# Patient Record
Sex: Female | Born: 1975 | Race: Black or African American | Hispanic: No | Marital: Married | Smoking: Never smoker
Health system: Southern US, Community
[De-identification: ages and names within clinical notes are randomized; demographics above are authoritative.]

## PROBLEM LIST (undated history)

## (undated) DIAGNOSIS — K219 Gastro-esophageal reflux disease without esophagitis: Secondary | ICD-10-CM

## (undated) DIAGNOSIS — O10919 Unspecified pre-existing hypertension complicating pregnancy, unspecified trimester: Secondary | ICD-10-CM

## (undated) DIAGNOSIS — G4482 Headache associated with sexual activity: Secondary | ICD-10-CM

## (undated) DIAGNOSIS — N939 Abnormal uterine and vaginal bleeding, unspecified: Secondary | ICD-10-CM

## (undated) DIAGNOSIS — I10 Essential (primary) hypertension: Secondary | ICD-10-CM

## (undated) DIAGNOSIS — O1490 Unspecified pre-eclampsia, unspecified trimester: Secondary | ICD-10-CM

## (undated) DIAGNOSIS — D649 Anemia, unspecified: Secondary | ICD-10-CM

## (undated) HISTORY — PX: HERNIA REPAIR: SHX51

## (undated) HISTORY — DX: Unspecified pre-eclampsia, unspecified trimester: O14.90

## (undated) HISTORY — DX: Headache associated with sexual activity: G44.82

## (undated) HISTORY — DX: Abnormal uterine and vaginal bleeding, unspecified: N93.9

## (undated) HISTORY — DX: Unspecified pre-existing hypertension complicating pregnancy, unspecified trimester: O10.919

## (undated) HISTORY — DX: Anemia, unspecified: D64.9

## (undated) HISTORY — DX: Gastro-esophageal reflux disease without esophagitis: K21.9

## (undated) HISTORY — DX: Essential (primary) hypertension: I10

---

## 2012-12-01 ENCOUNTER — Ambulatory Visit (INDEPENDENT_AMBULATORY_CARE_PROVIDER_SITE_OTHER): Payer: BC Managed Care – PPO | Admitting: Family Medicine

## 2012-12-01 ENCOUNTER — Ambulatory Visit: Payer: BC Managed Care – PPO

## 2012-12-01 VITALS — BP 185/107 | HR 73 | Temp 98.2°F | Resp 16 | Ht 66.0 in | Wt 213.0 lb

## 2012-12-01 DIAGNOSIS — R0789 Other chest pain: Secondary | ICD-10-CM

## 2012-12-01 DIAGNOSIS — R002 Palpitations: Secondary | ICD-10-CM

## 2012-12-01 LAB — POCT CBC
Granulocyte percent: 67.9 %G (ref 37–80)
HCT, POC: 40.8 % (ref 37.7–47.9)
Hemoglobin: 13 g/dL (ref 12.2–16.2)
Lymph, poc: 2.3 (ref 0.6–3.4)
MCH, POC: 31.3 pg — AB (ref 27–31.2)
MCHC: 31.9 g/dL (ref 31.8–35.4)
MCV: 98.3 fL — AB (ref 80–97)
MID (cbc): 0.8 (ref 0–0.9)
MPV: 9.7 fL (ref 0–99.8)
POC Granulocyte: 6.7 (ref 2–6.9)
POC LYMPH PERCENT: 23.8 %L (ref 10–50)
POC MID %: 8.3 %M (ref 0–12)
Platelet Count, POC: 420 10*3/uL (ref 142–424)
RBC: 4.15 M/uL (ref 4.04–5.48)
RDW, POC: 13.6 %
WBC: 9.8 10*3/uL (ref 4.6–10.2)

## 2012-12-01 NOTE — Progress Notes (Signed)
37 yo woman who works in [employer] presents with chest pain and palpitations.  The palpitations have been present off and on for a year. The chest pain began a couple days ago.  Patient did have holter monitor test over the past year which was negative, and a stress test was also normal.  She checks her blood pressure daily and it usually runs 130/85.  At the dentist, the pressure was elevated recently.  Patient is recently engaged and has been working on her weight with new diet.  F/Hx positive for HTN, CAD and diabetes  Patient has been sneezing quite a bit  Sig Negatives:  Shortness of breath, nausea, arm pain, syncope  Objective: NAD  BP 144/82 recheck Chest:  Clear Heart:  Regular with some variability corresponding to respirations.  No murmur Neck:  Supple, no adenopathy or thyromegaly. Abdomen:  Soft, nontender without HSM Ext:  No edema, no calf or thigh tenderness.  [facility] reading (PRIMARY) by  Dr. Milus Glazier CXR:  Normal EKG:  normal  Assessment: atypical chest pain  Plan:  Start magnesium supplement Palpitations - Plan: EKG 12-Lead, Comprehensive metabolic panel, TSH, Magnesium, EKG 12-Lead, POCT CBC  Other chest pain - Plan: EKG 12-Lead, Comprehensive metabolic panel, EKG 12-Lead, DG Chest 2 View, POCT CBC  Signed, Elvina Sidle, MD

## 2012-12-02 LAB — COMPREHENSIVE METABOLIC PANEL
ALT: 15 U/L (ref 0–35)
AST: 14 U/L (ref 0–37)
Albumin: 4.7 g/dL (ref 3.5–5.2)
Alkaline Phosphatase: 55 U/L (ref 39–117)
BUN: 17 mg/dL (ref 6–23)
CO2: 26 mEq/L (ref 19–32)
Calcium: 10.1 mg/dL (ref 8.4–10.5)
Chloride: 100 mEq/L (ref 96–112)
Creat: 0.77 mg/dL (ref 0.50–1.10)
Glucose, Bld: 88 mg/dL (ref 70–99)
Potassium: 4.1 mEq/L (ref 3.5–5.3)
Sodium: 136 mEq/L (ref 135–145)
Total Bilirubin: 0.4 mg/dL (ref 0.3–1.2)
Total Protein: 7.5 g/dL (ref 6.0–8.3)

## 2012-12-02 LAB — TSH: TSH: 0.986 u[IU]/mL (ref 0.350–4.500)

## 2012-12-02 LAB — MAGNESIUM: Magnesium: 1.9 mg/dL (ref 1.5–2.5)

## 2013-03-11 ENCOUNTER — Ambulatory Visit (INDEPENDENT_AMBULATORY_CARE_PROVIDER_SITE_OTHER): Payer: BC Managed Care – PPO | Admitting: Emergency Medicine

## 2013-03-11 VITALS — BP 142/80 | HR 71 | Temp 97.7°F | Resp 16 | Ht 67.0 in | Wt 214.6 lb

## 2013-03-11 DIAGNOSIS — R109 Unspecified abdominal pain: Secondary | ICD-10-CM

## 2013-03-11 LAB — POCT UA - MICROSCOPIC ONLY
Casts, Ur, LPF, POC: NEGATIVE
Mucus, UA: NEGATIVE
Yeast, UA: NEGATIVE

## 2013-03-11 LAB — POCT URINALYSIS DIPSTICK
Bilirubin, UA: NEGATIVE
Blood, UA: NEGATIVE
Glucose, UA: NEGATIVE
Nitrite, UA: NEGATIVE
Urobilinogen, UA: 0.2
pH, UA: 7

## 2013-03-11 LAB — POCT CBC
Granulocyte percent: 68.2 %G (ref 37–80)
HCT, POC: 39.6 % (ref 37.7–47.9)
Hemoglobin: 12.6 g/dL (ref 12.2–16.2)
Lymph, poc: 2.1 (ref 0.6–3.4)
MCV: 98.7 fL — AB (ref 80–97)
POC Granulocyte: 5.6 (ref 2–6.9)

## 2013-03-11 LAB — IFOBT (OCCULT BLOOD): IFOBT: NEGATIVE

## 2013-03-11 NOTE — Progress Notes (Signed)
  Subjective:    Patient ID: Bethany [name], female    DOB: April 19, 1976, 37 y.o.   MRN: [id number]  HPI  37 YO female here today with lower pelvic pain radiating to her buttock area. She felt during her period when she had a BM she would feel extra cramps. She has not had a BM today. She states she is pretty regular- going 1-2 times daily. She feels like she has to go but when she tries she is not successful. She has pressure and "bloating" like she has gas.   Her last period was in the middle of July. She is not currently on any birth control. Denies fever, chills or feeling sick.   Review of Systems     Objective:   Physical Exam chest is clear heart rate and murmurs abdomen soft there are no areas of tenderness rectal exam revealed an empty rectal vault.  Results for orders placed in visit on 03/11/13  POCT URINE PREGNANCY      Result Value Range   Preg Test, Ur Negative    IFOBT (OCCULT BLOOD)      Result Value Range   IFOBT Negative    POCT UA - MICROSCOPIC ONLY      Result Value Range   WBC, Ur, HPF, POC 5-8     RBC, urine, microscopic neg     Bacteria, U Microscopic small     Mucus, UA neg     Epithelial cells, urine per micros 5-8     Crystals, Ur, HPF, POC neg     Casts, Ur, LPF, POC neg     Yeast, UA neg    POCT URINALYSIS DIPSTICK      Result Value Range   Color, UA yellow     Clarity, UA clear     Glucose, UA neg     Bilirubin, UA neg     Ketones, UA neg     Spec Grav, UA 1.015     Blood, UA neg     pH, UA 7.0     Protein, UA neg     Urobilinogen, UA 0.2     Nitrite, UA neg     Leukocytes, UA Trace          Assessment & Plan:

## 2013-03-11 NOTE — Patient Instructions (Addendum)
Try a Dulcolax suppository or fleets enema Abdominal Pain Abdominal pain can be caused by many things. Your caregiver decides the seriousness of your pain by an examination and possibly blood tests and X-rays. Many cases can be observed and treated at home. Most abdominal pain is not caused by a disease and will probably improve without treatment. However, in many cases, more time must pass before a clear cause of the pain can be found. Before that point, it may not be known if you need more testing, or if hospitalization or surgery is needed. HOME CARE INSTRUCTIONS   Do not take laxatives unless directed by your caregiver.  Take pain medicine only as directed by your caregiver.  Only take over-the-counter or prescription medicines for pain, discomfort, or fever as directed by your caregiver.  Try a clear liquid diet (broth, tea, or water) for as long as directed by your caregiver. Slowly move to a bland diet as tolerated. SEEK IMMEDIATE MEDICAL CARE IF:   The pain does not go away.  You have a fever.  You keep throwing up (vomiting).  The pain is felt only in portions of the abdomen. Pain in the right side could possibly be appendicitis. In an adult, pain in the left lower portion of the abdomen could be colitis or diverticulitis.  You pass bloody or black tarry stools. MAKE SURE YOU:   Understand these instructions.  Will watch your condition.  Will get help right away if you are not doing well or get worse. Document Released: 04/30/2005 Document Revised: 10/13/2011 Document Reviewed: 03/08/2008 [facility] Patient Information 2014 [place], [place].  and if no results then try MiraLax.

## 2013-05-20 ENCOUNTER — Ambulatory Visit (INDEPENDENT_AMBULATORY_CARE_PROVIDER_SITE_OTHER): Payer: BC Managed Care – PPO | Admitting: Internal Medicine

## 2013-05-20 VITALS — BP 144/88 | HR 71 | Temp 98.8°F | Resp 17 | Ht 68.0 in | Wt 223.0 lb

## 2013-05-20 DIAGNOSIS — L0232 Furuncle of buttock: Secondary | ICD-10-CM

## 2013-05-20 DIAGNOSIS — L02224 Furuncle of groin: Secondary | ICD-10-CM

## 2013-05-20 DIAGNOSIS — L02239 Carbuncle of trunk, unspecified: Secondary | ICD-10-CM

## 2013-05-20 DIAGNOSIS — L0233 Carbuncle of buttock: Secondary | ICD-10-CM

## 2013-05-20 DIAGNOSIS — L02229 Furuncle of trunk, unspecified: Secondary | ICD-10-CM

## 2013-05-20 MED ORDER — DOXYCYCLINE HYCLATE 100 MG PO TABS: 100.0000 mg | ORAL_TABLET | Freq: Two times a day (BID) | ORAL | Status: DC

## 2013-05-20 MED ORDER — MUPIROCIN 2 % EX OINT: TOPICAL_OINTMENT | Freq: Three times a day (TID) | CUTANEOUS | Status: DC

## 2013-05-20 NOTE — Progress Notes (Signed)
  Subjective:    Patient ID: Bethany [name], female    DOB: Feb 04, 1976, 37 y.o.   MRN: [id number]  HPI 37 year old female present with an issue of a boil showing up around her groin that keeps coming back and not fully healing. Has a history of boils and they normally go away. Her brother lived with her for a while and he had MRSA. She just wants to make sure there is no infection. Her primary physician was Dr. Stevphen Meuse at [facility] before she just recently retired. Has been seeing an OB/GYN for PCOS.  She is not pregnant.  Review of Systems     Objective:   Physical Exam  Constitutional: She is oriented to person, place, and time. She appears well-developed and well-nourished.  HENT:  Head: Normocephalic.  Eyes: EOM are normal.  Pulmonary/Chest: Effort normal.  Musculoskeletal: Normal range of motion.  Neurological: She is alert and oriented to person, place, and time. She exhibits normal muscle tone. Coordination normal.  Skin: Lesion and rash noted. Rash is nodular and pustular. There is erythema.     Psychiatric: She has a normal mood and affect.   Has 2 boils, both small, with purulent discharge. No ID needed at this time.  CS done     Assessment & Plan:  MRSA precautions Doxy/Mupirocin

## 2013-05-20 NOTE — Patient Instructions (Signed)
Infecciones por estafilococo (Staphylococcal Infections) El estafilococo aureus (o Staph, abreviado en ingls) es un germen que puede causar infecciones, en especial sobre la piel lastimada o heridas. La meticilina es una droga que se [place] a veces para tratar estas infecciones. Si el germen es resistente a Manufacturing systems engineer, se lo denomina MRSA. La meticilina y algunas otras drogas pueden no funcionar para tratar la infeccin. Sin embargo, hay otras drogas antibiticas que podran utilizarse para tratar la infeccin. El mdico podr Education officer, environmental un cultivo de la herida, piel u otro sitio. Esto le ayudar a asegurarse de que tiene MRSA. A veces las personas sanas son portadoras de MRSA, pero tambin pueden ocasionar una infeccin.  Las infecciones por staph, incluido MRSA pueden contagiarse de persona a persona por contacto. Podr prevenir el contagio de MRSA en aquellos con los que vive o las personas que lo rodean si sigue estos pasos:  Mantenga las infecciones, supuras o pus cubiertas con vendas limpias y secas. Siga las instrucciones del mdico acerca del cuidado adecuado de la herida. El pus de las heridas infectadas puede contener MRSA y diseminar el germen (bacteria) a otros.  Aconseje a su familia y a otros contactos cercanos que laven sus manos frecuentemente con agua caliente y [place]. Esto debe realizarse especialmente cuando cambia los vendajes o toca la herida infectada o algn material contagioso.  Evite compartir elementos personales (toallas, toallitas, navajas, ropa o uniformes) que puedan haber tenido contacto con la herida infectada.  Lave la ropa de cama y de vestir con agua caliente y jabn para ropa. Secar la ropa en una secadora de aire caliente, en lugar de secarla al aire, ayudar a eliminar la bacteria de los gneros.  Avise a los profesionales que lo asisten que tiene una infeccin por MRSA. En el hospital se tomarn medidas para evitar el contagio.  Pregunte al profesional que lo  asiste sobre el regreso a la escuela o al trabajo si tiene una infeccin por MRSA o "Staph".  Si el mdico le ha recomendado una evaluacin de seguimiento, es importante que concurra a la cita. Si no cumple con el seguimiento podr resultar en una lesin crnica o permanente, dolor e incapacidad. Si existe algn problema que le impide concurrir a la cita, Multimedia programmer a las instalaciones para obtener asistencia. La infeccin por MRSA o "staph" puede ser muy grave y deber ponerse en contacto con el mdico si empeora. Para combatir la infeccin, siga las indicaciones del profesional que lo asiste para el cuidado de la herida, y tome todos los medicamentos que le hayan prescripto. SOLICITE ATENCIN MDICA SI:  Observa un aumento de secrecin de pus en la zona de la herida.  Tiene fiebre.  Advierte un olor ftido que proviene de la herida o del vendaje. SOLICITE ATENCIN MDICA DE INMEDIATO SI: Observa enrojecimiento, vetas rojizas, hinchazn, o aumenta el dolor en la herida. Document Released: 07/03/2008 Document Revised: 10/13/2011 [facility] Patient Information 2014 [place], [place]. MRSA Overview MRSA stands for methicillin-resistant Staphylococcus aureus. It is a type of bacteria that is resistant to some common antibiotics. It can cause infections in the skin and many other places in the body. Staphylococcus aureus, often called "staph," is a bacteria that normally lives on the skin or in the nose. Staph on the surface of the skin or in the nose does not cause problems. However, if the staph enters the body through a cut, wound, or break in the skin, an infection can happen. Up until recently, infections with the MRSA  type of staph mainly occurred in hospitals and other healthcare settings. There are now increasing problems with MRSA infections in the community as well. Infections with MRSA may be very serious or even life-threatening. Most MRSA infections are acquired in one of two  ways:  Healthcare-associated MRSA (HA-MRSA)  This can be acquired by people in any healthcare setting. MRSA can be a big problem for hospitalized people, people in nursing homes, people in rehabilitation facilities, people with weakened immune systems, dialysis patients, and those who have had surgery.  Community-associated MRSA (CA-MRSA)  Community spread of MRSA is becoming more common. It is known to spread in crowded settings, in jails and prisons, and in situations where there is close skin-to-skin contact, such as during sporting events or in locker rooms. MRSA can be spread through shared items, such as children's toys, razors, towels, or sports equipment. CAUSES  All staph, including MRSA, are normally harmless unless they enter the body through a scratch, cut, or wound, such as with surgery. All staph, including MRSA, can be spread from person-to-person by touching contaminated objects or through direct contact. SPECIAL GROUPS MRSA can present problems for special groups of people. Some of these groups include:  Breastfeeding women.  The most common problem is MRSA infection of the breast (mastitis). There is evidence that MRSA can be passed to an infant from infected breast milk. Your caregiver may recommend that you stop breastfeeding until the mastitis is under control.  If you are breastfeeding and have a MRSA infection in a place other than the breast, you may usually continue breastfeeding while under treatment. If taking antibiotics, ask your caregiver if it is safe to continue breastfeeding while taking your prescribed medicines.  Neonates (babies from birth to 54 month old) and infants (babies from 50 month to 83 year old).  There is evidence that MRSA can be passed to a newborn at birth if the mother has MRSA on the skin, in or around the birth canal, or an infection in the uterus, cervix, or vagina. MRSA infection can have the same appearance as a normal newborn or infant rash  or several other skin infections. This can make it hard to diagnose MRSA.  Immune compromised people.  If you have an immune system problem, you may have a higher chance of developing a MRSA infection.  People after any type of surgery.  Staph in general, including MRSA, is the most common cause of infections occurring at the site of recent surgery.  People on long-term steroid medicines.  These kinds of medicines can lower your resistance to infection. This can increase your chance of getting MRSA.  People who have had frequent hospitalizations, live in nursing homes or other residential care facilities, have venous or urinary catheters, or have taken multiple courses of antibiotic therapy for any reason. DIAGNOSIS  Diagnosis of MRSA is done by cultures of fluid samples that may come from:  Swabs taken from cuts or wounds in infected areas.  Nasal swabs.  Saliva or deep cough specimens from the lungs (sputum).  Urine.  Blood. Many people are "colonized" with MRSA but have no signs of infection. This means that people carry the MRSA germ on their skin or in their nose and may never develop MRSA infection.  TREATMENT  Treatment varies and is based on how serious, how deep, or how extensive the infection is. For example:  Some skin infections, such as a small boil or abscess, may be treated by draining yellowish-white fluid (pus)  from the site of the infection.  Deeper or more widespread soft tissue infections are usually treated with surgery to drain pus and with antibiotic medicine given by vein or by mouth. This may be recommended even if you are pregnant.  Serious infections may require a hospital stay. If antibiotics are given, they may be needed for several weeks. PREVENTION  Because many people are colonized with staph, including MRSA, preventing the spread of the bacteria from person-to-person is most important. The best way to prevent the spread of bacteria and other  germs is through proper hand washing or by using alcohol-based hand disinfectants. The following are other ways to help prevent MRSA infection within the hospital and community settings.   Healthcare settings:  Strict hand washing or hand disinfection procedures need to be followed before and after touching every patient.  Patients infected with MRSA are placed in isolation to prevent the spread of the bacteria.  Healthcare workers need to wear disposable gowns and gloves when touching or caring for patients infected with MRSA. Visitors may also be asked to wear a gown and gloves.  Hospital surfaces need to be disinfected frequently.  Community settings:  NIKE frequently with soap and water for at least 15 seconds. Otherwise, use alcohol-based hand disinfectants when soap and water is not available.  Make sure people who live with you wash their hands often, too.  Do not share personal items. For example, avoid sharing razors and other personal hygiene items, towels, clothing, and athletic equipment.  Wash and dry your clothes and bedding at the warmest temperatures recommended on the labels.  Keep wounds covered. Pus from infected sores may contain MRSA and other bacteria. Keep cuts and abrasions clean and covered with germ-free (sterile), dry bandages until they are healed.  If you have a wound that appears infected, ask your caregiver if a culture for MRSA and other bacteria should be done.  If you are breastfeeding, talk to your caregiver about MRSA. You may be asked to temporarily stop breastfeeding. HOME CARE INSTRUCTIONS   Take your antibiotics as directed. Finish them even if you start to feel better.  Avoid close contact with those around you as much as possible. Do not use towels, razors, toothbrushes, bedding, or other items that will be used by others.  To fight the infection, follow your caregiver's instructions for wound care. Wash your hands before and after  changing your bandages.  If you have an intravascular device, such as a catheter, make sure you know how to care for it.  Be sure to tell any healthcare providers that you have MRSA so they are aware of your infection. SEEK IMMEDIATE MEDICAL CARE IF:   The infection appears to be getting worse. Signs include:  Increased warmth, redness, or tenderness around the wound site.  A red line that extends from the infection site.  A dark color in the area around the infection.  Wound drainage that is tan, yellow, or green.  A bad smell coming from the wound.  You feel sick to your stomach (nauseous) and throw up (vomit) or cannot keep medicine down.  You have a fever.  Your baby is older than 3 months with a rectal temperature of 102 F (38.9 C) or higher.  Your baby is 38 months old or younger with a rectal temperature of 100.4 F (38 C) or higher.  You have difficulty breathing. MAKE SURE YOU:   Understand these instructions.  Will watch your condition.  Will get help right away if you are not doing well or get worse. Document Released: 07/21/2005 Document Revised: 10/13/2011 Document Reviewed: 10/23/2010 [facility] Patient Information 2014 [place], [place]. MRSA Overview MRSA stands for methicillin-resistant Staphylococcus aureus. It is a type of bacteria that is resistant to some common antibiotics. It can cause infections in the skin and many other places in the body. Staphylococcus aureus, often called "staph," is a bacteria that normally lives on the skin or in the nose. Staph on the surface of the skin or in the nose does not cause problems. However, if the staph enters the body through a cut, wound, or break in the skin, an infection can happen. Up until recently, infections with the MRSA type of staph mainly occurred in hospitals and other healthcare settings. There are now increasing problems with MRSA infections in the community as well. Infections with MRSA may be very  serious or even life-threatening. Most MRSA infections are acquired in one of two ways:  Healthcare-associated MRSA (HA-MRSA)  This can be acquired by people in any healthcare setting. MRSA can be a big problem for hospitalized people, people in nursing homes, people in rehabilitation facilities, people with weakened immune systems, dialysis patients, and those who have had surgery.  Community-associated MRSA (CA-MRSA)  Community spread of MRSA is becoming more common. It is known to spread in crowded settings, in jails and prisons, and in situations where there is close skin-to-skin contact, such as during sporting events or in locker rooms. MRSA can be spread through shared items, such as children's toys, razors, towels, or sports equipment. CAUSES  All staph, including MRSA, are normally harmless unless they enter the body through a scratch, cut, or wound, such as with surgery. All staph, including MRSA, can be spread from person-to-person by touching contaminated objects or through direct contact. SPECIAL GROUPS MRSA can present problems for special groups of people. Some of these groups include:  Breastfeeding women.  The most common problem is MRSA infection of the breast (mastitis). There is evidence that MRSA can be passed to an infant from infected breast milk. Your caregiver may recommend that you stop breastfeeding until the mastitis is under control.  If you are breastfeeding and have a MRSA infection in a place other than the breast, you may usually continue breastfeeding while under treatment. If taking antibiotics, ask your caregiver if it is safe to continue breastfeeding while taking your prescribed medicines.  Neonates (babies from birth to 34 month old) and infants (babies from 64 month to 43 year old).  There is evidence that MRSA can be passed to a newborn at birth if the mother has MRSA on the skin, in or around the birth canal, or an infection in the uterus, cervix, or  vagina. MRSA infection can have the same appearance as a normal newborn or infant rash or several other skin infections. This can make it hard to diagnose MRSA.  Immune compromised people.  If you have an immune system problem, you may have a higher chance of developing a MRSA infection.  People after any type of surgery.  Staph in general, including MRSA, is the most common cause of infections occurring at the site of recent surgery.  People on long-term steroid medicines.  These kinds of medicines can lower your resistance to infection. This can increase your chance of getting MRSA.  People who have had frequent hospitalizations, live in nursing homes or other residential care facilities, have venous or urinary catheters, or have taken  multiple courses of antibiotic therapy for any reason. DIAGNOSIS  Diagnosis of MRSA is done by cultures of fluid samples that may come from:  Swabs taken from cuts or wounds in infected areas.  Nasal swabs.  Saliva or deep cough specimens from the lungs (sputum).  Urine.  Blood. Many people are "colonized" with MRSA but have no signs of infection. This means that people carry the MRSA germ on their skin or in their nose and may never develop MRSA infection.  TREATMENT  Treatment varies and is based on how serious, how deep, or how extensive the infection is. For example:  Some skin infections, such as a small boil or abscess, may be treated by draining yellowish-white fluid (pus) from the site of the infection.  Deeper or more widespread soft tissue infections are usually treated with surgery to drain pus and with antibiotic medicine given by vein or by mouth. This may be recommended even if you are pregnant.  Serious infections may require a hospital stay. If antibiotics are given, they may be needed for several weeks. PREVENTION  Because many people are colonized with staph, including MRSA, preventing the spread of the bacteria from  person-to-person is most important. The best way to prevent the spread of bacteria and other germs is through proper hand washing or by using alcohol-based hand disinfectants. The following are other ways to help prevent MRSA infection within the hospital and community settings.   Healthcare settings:  Strict hand washing or hand disinfection procedures need to be followed before and after touching every patient.  Patients infected with MRSA are placed in isolation to prevent the spread of the bacteria.  Healthcare workers need to wear disposable gowns and gloves when touching or caring for patients infected with MRSA. Visitors may also be asked to wear a gown and gloves.  Hospital surfaces need to be disinfected frequently.  Community settings:  NIKE frequently with soap and water for at least 15 seconds. Otherwise, use alcohol-based hand disinfectants when soap and water is not available.  Make sure people who live with you wash their hands often, too.  Do not share personal items. For example, avoid sharing razors and other personal hygiene items, towels, clothing, and athletic equipment.  Wash and dry your clothes and bedding at the warmest temperatures recommended on the labels.  Keep wounds covered. Pus from infected sores may contain MRSA and other bacteria. Keep cuts and abrasions clean and covered with germ-free (sterile), dry bandages until they are healed.  If you have a wound that appears infected, ask your caregiver if a culture for MRSA and other bacteria should be done.  If you are breastfeeding, talk to your caregiver about MRSA. You may be asked to temporarily stop breastfeeding. HOME CARE INSTRUCTIONS   Take your antibiotics as directed. Finish them even if you start to feel better.  Avoid close contact with those around you as much as possible. Do not use towels, razors, toothbrushes, bedding, or other items that will be used by others.  To fight the  infection, follow your caregiver's instructions for wound care. Wash your hands before and after changing your bandages.  If you have an intravascular device, such as a catheter, make sure you know how to care for it.  Be sure to tell any healthcare providers that you have MRSA so they are aware of your infection. SEEK IMMEDIATE MEDICAL CARE IF:   The infection appears to be getting worse. Signs include:  Increased warmth,  redness, or tenderness around the wound site.  A red line that extends from the infection site.  A dark color in the area around the infection.  Wound drainage that is tan, yellow, or green.  A bad smell coming from the wound.  You feel sick to your stomach (nauseous) and throw up (vomit) or cannot keep medicine down.  You have a fever.  Your baby is older than 3 months with a rectal temperature of 102 F (38.9 C) or higher.  Your baby is 4 months old or younger with a rectal temperature of 100.4 F (38 C) or higher.  You have difficulty breathing. MAKE SURE YOU:   Understand these instructions.  Will watch your condition.  Will get help right away if you are not doing well or get worse. Document Released: 07/21/2005 Document Revised: 10/13/2011 Document Reviewed: 10/23/2010 [facility] Patient Information 2014 [place], [place].

## 2013-05-23 LAB — WOUND CULTURE: Gram Stain: NONE SEEN

## 2013-06-23 ENCOUNTER — Encounter: Payer: Self-pay | Admitting: Family Medicine

## 2013-06-23 ENCOUNTER — Ambulatory Visit (INDEPENDENT_AMBULATORY_CARE_PROVIDER_SITE_OTHER): Payer: BC Managed Care – PPO | Admitting: Family Medicine

## 2013-06-23 VITALS — BP 148/91 | HR 61 | Temp 98.0°F | Resp 16 | Ht 66.5 in | Wt 219.0 lb

## 2013-06-23 DIAGNOSIS — I10 Essential (primary) hypertension: Secondary | ICD-10-CM

## 2013-06-23 DIAGNOSIS — R002 Palpitations: Secondary | ICD-10-CM

## 2013-06-23 DIAGNOSIS — E282 Polycystic ovarian syndrome: Secondary | ICD-10-CM | POA: Insufficient documentation

## 2013-06-23 MED ORDER — LOSARTAN POTASSIUM-HCTZ 100-12.5 MG PO TABS: 1.0000 | ORAL_TABLET | Freq: Every day | ORAL | Status: DC

## 2013-06-23 NOTE — Patient Instructions (Addendum)
Hypertension As your heart beats, it forces blood through your arteries. This force is your blood pressure. If the pressure is too high, it is called hypertension (HTN) or high blood pressure. HTN is dangerous because you may have it and not know it. High blood pressure may mean that your heart has to work harder to pump blood. Your arteries may be narrow or stiff. The extra work puts you at risk for heart disease, stroke, and other problems.  Blood pressure consists of two numbers, a higher number over a lower, 110/72, for example. It is stated as "110 over 72." The ideal is below 120 for the top number (systolic) and under 80 for the bottom (diastolic). Write down your blood pressure today. You should pay close attention to your blood pressure if you have certain conditions such as:  Heart failure.  Prior heart attack.  Diabetes  Chronic kidney disease.  Prior stroke.  Multiple risk factors for heart disease. To see if you have HTN, your blood pressure should be measured while you are seated with your arm held at the level of the heart. It should be measured at least twice. A one-time elevated blood pressure reading (especially in the Emergency Department) does not mean that you need treatment. There may be conditions in which the blood pressure is different between your right and left arms. It is important to see your caregiver soon for a recheck. Most people have essential hypertension which means that there is not a specific cause. This type of high blood pressure may be lowered by changing lifestyle factors such as:  Stress.  Smoking.  Lack of exercise.  Excessive weight.  Drug/tobacco/alcohol use.  Eating less salt. Most people do not have symptoms from high blood pressure until it has caused damage to the body. Effective treatment can often prevent, delay or reduce that damage. TREATMENT  When a cause has been identified, treatment for high blood pressure is directed at the  cause. There are a large number of medications to treat HTN. These fall into several categories, and your caregiver will help you select the medicines that are best for you. Medications may have side effects. You should review side effects with your caregiver. If your blood pressure stays high after you have made lifestyle changes or started on medicines,   Your medication(s) may need to be changed.  Other problems may need to be addressed.  Be certain you understand your prescriptions, and know how and when to take your medicine.  Be sure to follow up with your caregiver within the time frame advised (usually within two weeks) to have your blood pressure rechecked and to review your medications.  If you are taking more than one medicine to lower your blood pressure, make sure you know how and at what times they should be taken. Taking two medicines at the same time can result in blood pressure that is too low. SEEK IMMEDIATE MEDICAL CARE IF:  You develop a severe headache, blurred or changing vision, or confusion.  You have unusual weakness or numbness, or a faint feeling.  You have severe chest or abdominal pain, vomiting, or breathing problems. MAKE SURE YOU:   Understand these instructions.  Will watch your condition.  Will get help right away if you are not doing well or get worse. Document Released: 07/21/2005 Document Revised: 10/13/2011 Document Reviewed: 03/10/2008 [facility] Patient Information 2014 [place], [place]. Lactose Intolerance, Adult Lactose intolerance is when the body is not able to digest lactose, a  sugar found in milk and milk products. Lactose intolerance is caused by your body not producing enough of the enzyme lactase. When there is not enough lactase to digest the amount of lactose consumed, discomfort may be felt. Lactose intolerance is not a milk allergy. For most people, lactase deficiency is a condition that develops naturally over time. After about the age  of 2, the body begins to produce less lactase. But many people may not experience symptoms until they are much older. CAUSES Things that can cause you to be lactose intolerant include:  Aging.  Being born without the ability to make lactase.  Certain digestive diseases.  Injuries to the small intestine. SYMPTOMS   Feeling sick to your stomach (nauseous).  Diarrhea.  Cramps.  Bloating.  Gas. Symptoms usually show up a half hour or 2 hours after eating or drinking products containing lactose. TREATMENT  No treatment can improve the body's ability to produce lactase. However, symptoms can be controlled through diet. A medicine may be given to you to take when you consume lactose-containing foods or drinks. The medicine contains the lactase enzyme, which help the body digest lactose better. HOME CARE INSTRUCTIONS  Eat or drink dairy products as told by your caregiver or dietician.  Take all medicine as directed by your caregiver.  Find lactose-free or lactose-reduced products at your local grocery store.  Talk to your caregiver or dietician to decide if you need any dietary supplements. The following is the amount of calcium needed from the diet:  19 to 50 years: 1000 mg  Over 50 years: 1200 mg Calcium and Lactose in Common Foods Non-Dairy Products / Calcium Content (mg)  Calcium-fortified orange juice, 1 cup / 308 to 344 mg  Sardines, with edible bones, 3 oz / 270 mg  Salmon, canned, with edible bones, 3 oz / 205 mg  Soymilk, fortified, 1 cup / 200 mg  Broccoli (raw), 1 cup / 90 mg  Orange, 1 medium / 50 mg  Pinto beans,  cup / 40 mg  Tuna, canned, 3 oz / 10 mg  Lettuce greens,  cup / 10 mg Dairy Products / Calcium Content (mg) / Lactose Content (g)  Yogurt, plain, low-fat, 1 cup / 415 mg / 5 g  Milk, reduced fat, 1 cup / 295 mg / 11 g  Swiss cheese, 1 oz / 270 mg / 1 g  Ice cream,  cup / 85 mg / 6 g  Cottage cheese,  cup / 75 mg / 2 to 3 g SEEK  MEDICAL CARE IF: You have no relief from your symptoms. Document Released: 07/21/2005 Document Revised: 10/13/2011 Document Reviewed: 10/18/2010 [facility] Patient Information 2014 [place], [place]. Polycystic Ovarian Syndrome Polycystic ovarian syndrome is a condition with a number of problems. One problem is with the ovaries. The ovaries are organs located in the female pelvis, on each side of the uterus. Usually, during the menstrual cycle, an egg is released from 1 ovary every month. This is called ovulation. When the egg is fertilized, it goes into the womb (uterus), which allows for the growth of a baby. The egg travels from the ovary through the fallopian tube to the uterus. The ovaries also make the hormones estrogen and progesterone. These hormones help the development of a woman's breasts, body shape, and body hair. They also regulate the menstrual cycle and pregnancy. Sometimes, cysts form in the ovaries. A cyst is a fluid-filled sac. On the ovary, different types of cysts can form. The most common  type of ovarian cyst is called a functional or ovulation cyst. It is normal, and often forms during the normal menstrual cycle. Each month, a woman's ovaries grow tiny cysts that hold the eggs. When an egg is fully grown, the sac breaks open. This releases the egg. Then, the sac which released the egg from the ovary dissolves. In one type of functional cyst, called a follicle cyst, the sac does not break open to release the egg. It may actually continue to grow. This type of cyst usually disappears within 1 to 3 months.  One type of cyst problem with the ovaries is called Polycystic Ovarian Syndrome (PCOS). In this condition, many follicle cysts form, but do not rupture and produce an egg. This health problem can affect the following:  Menstrual cycle.  Heart.  Obesity.  Cancer of the uterus.  Fertility.  Blood vessels.  Hair growth (face and body) or  baldness.  Hormones.  Appearance.  High blood pressure.  Stroke.  Insulin production.  Inflammation of the liver.  Elevated blood cholesterol and triglycerides. CAUSES   No one knows the exact cause of PCOS.  Women with PCOS often have a mother or sister with PCOS. There is not yet enough proof to say this is inherited.  Many women with PCOS have a weight problem.  Researchers are looking at the relationship between PCOS and the body's ability to make insulin. Insulin is a hormone that regulates the change of sugar, starches, and other food into energy for the body's use, or for storage. Some women with PCOS make too much insulin. It is possible that the ovaries react by making too many female hormones, called androgens. This can lead to acne, excessive hair growth, weight gain, and ovulation problems.  Too much production of luteinizing hormone (LH) from the pituitary gland in the brain stimulates the ovary to produce too much female hormone (androgen). SYMPTOMS   Infrequent or no menstrual periods, and/or irregular bleeding.  Inability to get pregnant (infertility), because of not ovulating.  Increased growth of hair on the face, chest, stomach, back, thumbs, thighs, or toes.  Acne, oily skin, or dandruff.  Pelvic pain.  Weight gain or obesity, usually carrying extra weight around the waist.  Type 2 diabetes (this is the diabetes that usually does not need insulin).  High cholesterol.  High blood pressure.  Female-pattern baldness or thinning hair.  Patches of thickened and dark brown or black skin on the neck, arms, breasts, or thighs.  Skin tags, or tiny excess flaps of skin, in the armpits or neck area.  Sleep apnea (excessive snoring and breathing stops at times while asleep).  Deepening of the voice.  Gestational diabetes when pregnant.  Increased risk of miscarriage with pregnancy. DIAGNOSIS  There is no single test to diagnose PCOS.   Your caregiver  will:  Take a medical history.  Perform a pelvic exam.  Perform an ultrasound.  Check your female and female hormone levels.  Measure glucose or sugar levels in the blood.  Do other blood tests.  If you are producing too many female hormones, your caregiver will make sure it is from PCOS. At the physical exam, your caregiver will want to evaluate the areas of increased hair growth. Try to allow natural hair growth for a few days before the visit.  During a pelvic exam, the ovaries may be enlarged or swollen by the increased number of small cysts. This can be seen more easily by vaginal ultrasound or screening,  to examine the ovaries and lining of the uterus (endometrium) for cysts. The uterine lining may become thicker, if there has not been a regular period. TREATMENT  Because there is no cure for PCOS, it needs to be managed to prevent problems. Treatments are based on your symptoms. Treatment is also based on whether you want to have a baby or whether you need contraception.  Treatment may include:  Progesterone hormone, to start a menstrual period.  Birth control pills, to make you have regular menstrual periods.  Medicines to make you ovulate, if you want to get pregnant.  Medicines to control your insulin.  Medicine to control your blood pressure.  Medicine and diet, to control your high cholesterol and triglycerides in your blood.  Surgery, making small holes in the ovary, to decrease the amount of female hormone production. This is done through a long, lighted tube (laparoscope), placed into the pelvis through a tiny incision in the lower abdomen. Your caregiver will go over some of the choices with you. WOMEN WITH PCOS HAVE THESE CHARACTERISTICS:  High levels of female hormones called androgens.  An irregular or no menstrual cycle.  May have many small cysts in their ovaries. PCOS is the most common hormonal reproductive problem in women of childbearing age. WHY DO WOMEN  WITH PCOS HAVE TROUBLE WITH THEIR MENSTRUAL CYCLE? Each month, about 20 eggs start to mature in the ovaries. As one egg grows and matures, the follicle breaks open to release the egg, so it can travel through the fallopian tube for fertilization. When the single egg leaves the follicle, ovulation takes place. In women with PCOS, the ovary does not make all of the hormones it needs for any of the eggs to fully mature. They may start to grow and accumulate fluid, but no one egg becomes large enough. Instead, some may remain as cysts. Since no egg matures or is released, ovulation does not occur and the hormone progesterone is not made. Without progesterone, a woman's menstrual cycle is irregular or absent. Also, the cysts produce female hormones, which continue to prevent ovulation.  Document Released: 11/14/2004 Document Revised: 10/13/2011 Document Reviewed: 01/06/2013 [facility] Patient Information 2014 [place], [place].

## 2013-06-23 NOTE — Progress Notes (Signed)
Patient ID: Bethany [name] MRN: [id number], DOB: 04/10/1976, 37 y.o. Date of Encounter: 06/23/2013, 12:31 PM  Primary Physician: No PCP Per Patient  Chief Complaint: HTN  HPI: 37 y.o. year old female with history below presents for hypertension follow up. She had been taking losartan and metoprolol until she saw her gynecologist who recommended she come off losartan K. she wants to have another child. At this point patient is not trying to get pregnant and is holding off on more children until she gets her blood pressure control and loses some weight. She had preeclampsia with her first child.  Patient has been diagnosed with PCO S. by her gynecologist. She has no side effects with losartan. She's had no more palpitations  No CP, HA, visual changes, or focal deficits.   Past Medical History  Diagnosis Date  . GERD (gastroesophageal reflux disease)   . Hypertension      Home Meds: Prior to Admission medications   Medication Sig Start Date End Date Taking? Authorizing Provider  docusate sodium (COLACE) 100 MG capsule Take 100 mg by mouth daily.    Historical Provider, MD  doxycycline (VIBRA-TABS) 100 MG tablet Take 1 tablet (100 mg total) by mouth 2 (two) times daily. 05/20/13   Jonita Albee, MD  folic acid (FOLVITE) 1 MG tablet Take 1 mg by mouth daily.    Historical Provider, MD  losartan (COZAAR) 100 MG tablet Take 100 mg by mouth daily.    Historical Provider, MD  metoprolol-hydrochlorothiazide (LOPRESSOR HCT) 50-25 MG per tablet Take 1 tablet by mouth daily.    Historical Provider, MD  mupirocin ointment (BACTROBAN) 2 % Apply topically 3 (three) times daily. 05/20/13   Jonita Albee, MD  simethicone (MYLICON) 125 MG chewable tablet Chew 125 mg by mouth daily.    Historical Provider, MD  zinc sulfate 220 MG capsule Take 220 mg by mouth daily.    Historical Provider, MD    Allergies: No Known Allergies  History   Social History  . Marital Status: Married    Spouse Name:  N/A    Number of Children: N/A  . Years of Education: N/A   Occupational History  . Not on file.   Social History Main Topics  . Smoking status: Never Smoker   . Smokeless tobacco: Not on file  . Alcohol Use: No  . Drug Use: No  . Sexual Activity: Yes    Birth Control/ Protection: None   Other Topics Concern  . Not on file   Social History Narrative  . No narrative on file     Family History  Problem Relation Age of Onset  . Hypertension Mother   . Cancer Father   . Diabetes Father   . Hypertension Father   . Diabetes Brother   . Kidney failure Brother   . Heart attack Paternal Grandmother   . Heart attack Paternal Grandfather     Review of Systems: Constitutional: negative for chills, fever, night sweats, weight changes, or fatigue  HEENT: negative for vision changes, hearing loss, congestion, rhinorrhea, ST, epistaxis, or sinus pressure Cardiovascular: negative for chest pain, palpitations, or DOE Respiratory: negative for hemoptysis, wheezing, shortness of breath, or cough Abdominal: negative for abdominal pain, nausea, vomiting, diarrhea, or constipation Dermatological: negative for rash Neurologic: negative for headache, dizziness, or syncope All other systems reviewed and are otherwise negative with the exception to those above and in the HPI.   Physical Exam: There were no vitals taken for this  visit., There is no weight on file to calculate BMI. General: Well developed, well nourished, in no acute distress. Head: Normocephalic, atraumatic, eyes without discharge, sclera non-icteric, nares are without discharge. Bilateral auditory canals clear, TM's are without perforation, pearly grey and translucent with reflective cone of light bilaterally. Oral cavity moist, posterior pharynx without exudate, erythema, peritonsillar abscess, or post nasal drip.  Neck: Supple. No thyromegaly. Full ROM. No lymphadenopathy. No carotid bruits. Lungs: Clear bilaterally to  auscultation without wheezes, rales, or rhonchi. Breathing is unlabored. Heart: RRR with S1 S2. No murmurs, rubs, or gallops appreciated.  Abdomen: Soft, non-tender, non-distended with normoactive bowel sounds. No hepatosplenomegaly. No rebound/guarding. No obvious abdominal masses. Msk:  Strength and tone normal for age. Extremities/Skin: Warm and dry. No clubbing or cyanosis. No edema. No rashes or suspicious lesions. Distal pulses 2+ and equal bilaterally. Neuro: Alert and oriented X 3. Moves all extremities spontaneously. Gait is normal. CNII-XII grossly in tact. DTR 2+, cerebellar function intact. Rhomberg normal. Psych:  Responds to questions appropriately with a normal affect.    ASSESSMENT AND PLAN:  37 y.o. year old female with hypertension, not well controlled at this point.  Hypertension - Plan: losartan-hydrochlorothiazide (HYZAAR) 100-12.5 MG per tablet  PCOS (polycystic ovarian syndrome)   Signed, Elvina Sidle, MD 06/23/2013 12:31 PM

## 2013-06-24 ENCOUNTER — Other Ambulatory Visit: Payer: Self-pay | Admitting: Internal Medicine

## 2013-06-27 NOTE — Telephone Encounter (Signed)
Do you want to RF this or have pt RTC? She was just seen again for another issue but this was not addressed.

## 2013-08-17 ENCOUNTER — Encounter: Payer: Self-pay | Admitting: Family Medicine

## 2013-08-17 ENCOUNTER — Ambulatory Visit (INDEPENDENT_AMBULATORY_CARE_PROVIDER_SITE_OTHER): Payer: BC Managed Care – PPO | Admitting: Family Medicine

## 2013-08-17 VITALS — BP 143/101 | HR 80 | Temp 98.2°F | Resp 16 | Ht 66.5 in | Wt 216.0 lb

## 2013-08-17 DIAGNOSIS — Z22322 Carrier or suspected carrier of Methicillin resistant Staphylococcus aureus: Secondary | ICD-10-CM

## 2013-08-17 DIAGNOSIS — K589 Irritable bowel syndrome without diarrhea: Secondary | ICD-10-CM

## 2013-08-17 DIAGNOSIS — I10 Essential (primary) hypertension: Secondary | ICD-10-CM

## 2013-08-17 DIAGNOSIS — Z0000 Encounter for general adult medical examination without abnormal findings: Secondary | ICD-10-CM

## 2013-08-17 LAB — COMPREHENSIVE METABOLIC PANEL
ALT: 18 U/L (ref 0–35)
AST: 17 U/L (ref 0–37)
Albumin: 4.4 g/dL (ref 3.5–5.2)
Alkaline Phosphatase: 66 U/L (ref 39–117)
BUN: 13 mg/dL (ref 6–23)
CO2: 28 mEq/L (ref 19–32)
Calcium: 9.8 mg/dL (ref 8.4–10.5)
Chloride: 101 mEq/L (ref 96–112)
Creat: 0.65 mg/dL (ref 0.50–1.10)
Glucose, Bld: 97 mg/dL (ref 70–99)
Potassium: 4.2 mEq/L (ref 3.5–5.3)
Sodium: 137 mEq/L (ref 135–145)
Total Bilirubin: 0.6 mg/dL (ref 0.3–1.2)
Total Protein: 7.2 g/dL (ref 6.0–8.3)

## 2013-08-17 LAB — CBC
HCT: 37.5 % (ref 36.0–46.0)
Hemoglobin: 12.8 g/dL (ref 12.0–15.0)
MCH: 31.1 pg (ref 26.0–34.0)
MCHC: 34.1 g/dL (ref 30.0–36.0)
MCV: 91.2 fL (ref 78.0–100.0)
Platelets: 375 10*3/uL (ref 150–400)
RBC: 4.11 MIL/uL (ref 3.87–5.11)
RDW: 15 % (ref 11.5–15.5)
WBC: 5.6 10*3/uL (ref 4.0–10.5)

## 2013-08-17 LAB — POCT URINALYSIS DIPSTICK
Bilirubin, UA: NEGATIVE
Blood, UA: NEGATIVE
Glucose, UA: NEGATIVE
Ketones, UA: NEGATIVE
Leukocytes, UA: NEGATIVE
Nitrite, UA: NEGATIVE
Protein, UA: NEGATIVE
Spec Grav, UA: 1.02
Urobilinogen, UA: 0.2
pH, UA: 6.5

## 2013-08-17 LAB — LIPID PANEL
Cholesterol: 163 mg/dL (ref 0–200)
HDL: 54 mg/dL (ref >39)
LDL Cholesterol: 93 mg/dL (ref 0–99)
Total CHOL/HDL Ratio: 3 Ratio
Triglycerides: 78 mg/dL (ref <150)
VLDL: 16 mg/dL (ref 0–40)

## 2013-08-17 LAB — POCT SEDIMENTATION RATE: POCT SED RATE: 22 mm/hr (ref 0–22)

## 2013-08-17 LAB — TSH: TSH: 0.811 u[IU]/mL (ref 0.350–4.500)

## 2013-08-17 LAB — T4, FREE: Free T4: 1.41 ng/dL (ref 0.80–1.80)

## 2013-08-17 MED ORDER — DOXYCYCLINE HYCLATE 100 MG PO TABS
100.0000 mg | ORAL_TABLET | Freq: Two times a day (BID) | ORAL | Status: DC
Start: 2013-08-17 — End: 2013-11-19

## 2013-08-17 MED ORDER — LOSARTAN POTASSIUM-HCTZ 100-12.5 MG PO TABS: 1.0000 | ORAL_TABLET | Freq: Every day | ORAL | Status: DC

## 2013-08-17 NOTE — Patient Instructions (Signed)

## 2013-08-17 NOTE — Progress Notes (Signed)
   Subjective:    Patient ID: Bethany [name], female    DOB: 1976/03/22, 38 y.o.   MRN: [id number]  HPI 38 yo woman here for CPE Since September, LLQ of abdomen has had intermittent poorly defined pain associated with constipation and loose stools as well as back pain.  She saw gyn who diagnosed PCOS and small fibroid via vag probe ultrasound. She has been avoiding gluten which has resulted in decrease symptoms and decreased palpitations.  Married, 2 children.  Considering having a third child.  F/Hx:  Father died of stomach cancer last year  Review of Systems  Eyes: Positive for visual disturbance.  Cardiovascular: Positive for palpitations.  Gastrointestinal: Positive for diarrhea, constipation and abdominal distention.  Genitourinary: Positive for dysuria, urgency, menstrual problem and pelvic pain.  Musculoskeletal: Positive for arthralgias and back pain.       Objective:   Physical Exam Alert AA woman in NAD HEENT:  Missing tooth number15, cavity number16;  Fundi are normal, TM's normal Neck: supple, no adenop or thyromegaly Chest:  Clear Heart:  Reg, no murmur Abdomen:  Soft, no HSM, no mass or tenderness Ext:  Small bunion right  Foot, good pulses, no other deformitis  Vasc: no edema, normal pulses Skin:  clear    Assessment & Plan:  Annual physical exam - Plan: CBC, Lipid panel, Comprehensive metabolic panel, POCT urinalysis dipstick, POCT SEDIMENTATION RATE, TSH, T4, Free  IBS (irritable bowel syndrome) - Plan: Ambulatory referral to Gastroenterology, POCT SEDIMENTATION RATE, TSH, T4, Free  Signed, Robyn Haber, MD

## 2013-08-17 NOTE — Progress Notes (Signed)
Patient ID: ALYZZA ANDRINGA MRN: [id number], DOB: [birth year], 38 y.o. Date of Encounter: 08/17/2013, 8:31 AM  Primary Physician: No PCP Per Patient  Chief Complaint: Physical (CPE)  HPI: 38 y.o. y/o female with history of noted below here for CPE.  Doing well. Having alternating constipation with diarrhea. Works for Careers adviser at [employer], married with two daughters 90 and 10 Review of Systems: Consitutional: No fever, chills, fatigue, night sweats, lymphadenopathy, or weight changes. Eyes: No visual changes, eye redness, or discharge. ENT/Mouth: Ears: No otalgia, tinnitus, hearing loss, discharge. Nose: No congestion, rhinorrhea, sinus pain, or epistaxis. Throat: No sore throat, post nasal drip, or teeth pain. Cardiovascular: No CP, palpitations, diaphoresis, DOE, edema, orthopnea, PND. Respiratory: No cough, hemoptysis, SOB, or wheezing. Gastrointestinal: No anorexia, dysphagia, reflux, pain, nausea, vomiting, hematemesis,  BRBPR, or melena. Breast: No discharge, pain, swelling, or mass. Genitourinary: No dysuria, frequency, urgency, hematuria, incontinence, nocturia, amenorrhea, vaginal discharge, pruritis, burning, abnormal bleeding, or pain. Musculoskeletal: No decreased ROM, myalgias, stiffness, joint swelling, or weakness. Skin: No rash, erythema, lesion changes, pain, warmth, jaundice, or pruritis. Neurological: No headache, dizziness, syncope, seizures, tremors, memory loss, coordination problems, or paresthesias. Psychological: No anxiety, depression, hallucinations, SI/HI. Endocrine: No fatigue, polydipsia, polyphagia, polyuria, or known diabetes. All other systems were reviewed and are otherwise negative.  Past Medical History  Diagnosis Date  . GERD (gastroesophageal reflux disease)   . Hypertension      Past Surgical History  Procedure Laterality Date  . Hernia repair      Home Meds:  Prior to Admission medications     Medication Sig Start Date End Date Taking? Authorizing Provider  calcium carbonate 200 MG capsule Take 250 mg by mouth 2 (two) times daily with a meal.   Yes Historical Provider, MD  cholecalciferol (VITAMIN D) 1000 UNITS tablet Take 1,000 Units by mouth daily.   Yes Historical Provider, MD  folic acid (FOLVITE) 1 MG tablet Take 1 mg by mouth daily.   Yes Historical Provider, MD  losartan-hydrochlorothiazide (HYZAAR) 100-12.5 MG per tablet Take 1 tablet by mouth daily. 06/23/13  Yes Robyn Haber, MD  Specialty Vitamins Products (MAGNESIUM, AMINO ACID CHELATE,) 133 MG tablet Take 1 tablet by mouth 2 (two) times daily.   Yes Historical Provider, MD  zinc sulfate 220 MG capsule Take 220 mg by mouth daily.   Yes Historical Provider, MD  mupirocin ointment (BACTROBAN) 2 % APPLY TOPICALLY 3 (THREE) TIMES DAILY. 06/24/13   Theda Sers, PA-C    Allergies: No Known Allergies  History   Social History  . Marital Status: Married    Spouse Name: N/A    Number of Children: N/A  . Years of Education: N/A   Occupational History  . Not on file.   Social History Main Topics  . Smoking status: Never Smoker   . Smokeless tobacco: Not on file  . Alcohol Use: No  . Drug Use: No  . Sexual Activity: Yes    Birth Control/ Protection: None   Other Topics Concern  . Not on file   Social History Narrative  . No narrative on file    Family History  Problem Relation Age of Onset  . Hypertension Mother   . Cancer Father   . Diabetes Father   . Hypertension Father   . Diabetes Brother   . Kidney failure Brother   . Heart attack Paternal Grandmother   . Heart attack Paternal Grandfather     Physical  Exam: Blood pressure 146/94, pulse 76, temperature 98.2 F (36.8 C), resp. rate 16, height 5' 6.5" (1.689 m), weight 216 lb (97.977 kg), last menstrual period 07/06/2013, SpO2 97.00%., Body mass index is 34.35 kg/(m^2). General: Well developed, well nourished, in no acute distress. HEENT:  Normocephalic, atraumatic. Conjunctiva pink, sclera non-icteric. Pupils 2 mm constricting to 1 mm, round, regular, and equally reactive to light and accomodation. EOMI. Internal auditory canal clear. TMs with good cone of light and without pathology. Nasal mucosa pink. Nares are without discharge. No sinus tenderness. Oral mucosa pink. Dentition carie number 16, missing number 15. Pharynx without exudate.   Neck: Supple. Trachea midline. No thyromegaly. Full ROM. No lymphadenopathy. Lungs: Clear to auscultation bilaterally without wheezes, rales, or rhonchi. Breathing is of normal effort and unlabored. Cardiovascular: RRR with S1 S2. No murmurs, rubs, or gallops appreciated. Distal pulses 2+ symmetrically. No carotid or abdominal bruits. Abdomen: Soft, non-tender, non-distended with normoactive bowel sounds. No hepatosplenomegaly or masses. No rebound/guarding. No CVA tenderness. Without hernias.  Musculoskeletal: Full range of motion and 5/5 strength throughout. Without swelling, atrophy, tenderness, crepitus, or warmth. Extremities without clubbing, cyanosis, or edema. Calves supple. Skin: Warm and moist without erythema, ecchymosis, wounds, or rash. Neuro: A+Ox3. CN II-XII grossly intact. Moves all extremities spontaneously. Full sensation throughout. Normal gait. DTR 2+ throughout upper and lower extremities. Finger to nose intact. Psych:  Responds to questions appropriately with a normal affect.   Studies: CBC, CMET, Lipid, TSH pending. Patient taking good health measures UA:  Results for orders placed in visit on 08/17/13  POCT URINALYSIS DIPSTICK      Result Value Range   Color, UA yellow     Clarity, UA cloudy     Glucose, UA neg     Bilirubin, UA neg     Ketones, UA neg     Spec Grav, UA 1.020     Blood, UA neg     pH, UA 6.5     Protein, UA neg     Urobilinogen, UA 0.2     Nitrite, UA neg     Leukocytes, UA Negative       Assessment/Plan:  38 y.o. y/o female here for  CPE Annual physical exam - Plan: CBC, Lipid panel, Comprehensive metabolic panel, POCT urinalysis dipstick, POCT SEDIMENTATION RATE, TSH, T4, Free  IBS (irritable bowel syndrome) - Plan: Ambulatory referral to Gastroenterology, POCT SEDIMENTATION RATE, TSH, T4, Free  MRSA (methicillin resistant staph aureus) culture positive - Plan: doxycycline (VIBRA-TABS) 100 MG tablet  Hypertension - Plan: losartan-hydrochlorothiazide (HYZAAR) 100-12.5 MG per tablet   Signed, Robyn Haber, MD 08/17/2013 8:31 AM

## 2013-08-24 ENCOUNTER — Encounter: Payer: BC Managed Care – PPO | Admitting: Family Medicine

## 2013-11-19 ENCOUNTER — Ambulatory Visit (INDEPENDENT_AMBULATORY_CARE_PROVIDER_SITE_OTHER): Payer: BC Managed Care – PPO | Admitting: Physician Assistant

## 2013-11-19 VITALS — BP 134/90 | HR 76 | Temp 99.0°F | Resp 18 | Ht 67.0 in | Wt 219.0 lb

## 2013-11-19 DIAGNOSIS — J020 Streptococcal pharyngitis: Secondary | ICD-10-CM

## 2013-11-19 DIAGNOSIS — J029 Acute pharyngitis, unspecified: Secondary | ICD-10-CM

## 2013-11-19 MED ORDER — FIRST-DUKES MOUTHWASH MT SUSP: 10.0000 mL | OROMUCOSAL | Status: DC | PRN

## 2013-11-19 MED ORDER — PENICILLIN V POTASSIUM 500 MG PO TABS: 500.0000 mg | ORAL_TABLET | Freq: Two times a day (BID) | ORAL | Status: DC

## 2013-11-19 NOTE — Patient Instructions (Signed)
The antibiotic prescribed today is for your present infection only. It is very important to follow the directions for the medication prescribed. Antibiotics are generally given for a specified period of time (7-10 days, for example) to be taken at specific intervals (every 4, 6, 8 or 12 hours). This is necessary to keep the right amount of the medication in the bloodstream. Too much of the medication may cause an adverse reaction, too little may not be completely effective.  To clear your infection completely, continue taking the antibiotic for the full time of treatment, even if you begin to feel better after a few days.  If you miss a dose of the antibiotic, take it as soon as possible. Then go back to your regular dosing schedule. However, don't double up doses.    Begin taking the penicillin today.  Be sure to finish the full course.  Take with food and water to reduce stomach upset  Tylenol and/or Advil for pain relief  Use the magic mouthwash as frequently as every 2 hours if needed for throat pain  Plenty of fluids and rest  Get a new toothbrush  Do not share food/drinks, kisses on the cheek for the next 3-4 days to avoid spreading infection  Please let us know if any symptoms are worsening or not improving   Strep Throat Strep throat is an infection of the throat caused by a bacteria named Streptococcus pyogenes. Your caregiver may call the infection streptococcal "tonsillitis" or "pharyngitis" depending on whether there are signs of inflammation in the tonsils or back of the throat. Strep throat is most common in children aged 5 15 years during the cold months of the year, but it can occur in people of any age during any season. This infection is spread from person to person (contagious) through coughing, sneezing, or other close contact. SYMPTOMS   Fever or chills.  Painful, swollen, red tonsils or throat.  Pain or difficulty when swallowing.  White or yellow spots on the tonsils  or throat.  Swollen, tender lymph nodes or "glands" of the neck or under the jaw.  Red rash all over the body (rare). DIAGNOSIS  Many different infections can cause the same symptoms. A test must be done to confirm the diagnosis so the right treatment can be given. A "rapid strep test" can help your caregiver make the diagnosis in a few minutes. If this test is not available, a light swab of the infected area can be used for a throat culture test. If a throat culture test is done, results are usually available in a day or two. TREATMENT  Strep throat is treated with antibiotic medicine. HOME CARE INSTRUCTIONS   Gargle with 1 tsp of salt in 1 cup of warm water, 3 4 times per day or as needed for comfort.  Family members who also have a sore throat or fever should be tested for strep throat and treated with antibiotics if they have the strep infection.  Make sure everyone in your household washes their hands well.  Do not share food, drinking cups, or personal items that could cause the infection to spread to others.  You may need to eat a soft food diet until your sore throat gets better.  Drink enough water and fluids to keep your urine clear or pale yellow. This will help prevent dehydration.  Get plenty of rest.  Stay home from school, daycare, or work until you have been on antibiotics for 24 hours.  Only take  over-the-counter or prescription medicines for pain, discomfort, or fever as directed by your caregiver.  If antibiotics are prescribed, take them as directed. Finish them even if you start to feel better. SEEK MEDICAL CARE IF:   The glands in your neck continue to enlarge.  You develop a rash, cough, or earache.  You cough up green, yellow-brown, or bloody sputum.  You have pain or discomfort not controlled by medicines.  Your problems seem to be getting worse rather than better. SEEK IMMEDIATE MEDICAL CARE IF:   You develop any new symptoms such as vomiting,  severe headache, stiff or painful neck, chest pain, shortness of breath, or trouble swallowing.  You develop severe throat pain, drooling, or changes in your voice.  You develop swelling of the neck, or the skin on the neck becomes red and tender.  You have a fever.  You develop signs of dehydration, such as fatigue, dry mouth, and decreased urination.  You become increasingly sleepy, or you cannot wake up completely. Document Released: 07/18/2000 Document Revised: 07/07/2012 Document Reviewed: 09/19/2010 [facility] Patient Information 2014 [place], [place].

## 2013-11-19 NOTE — Progress Notes (Signed)
   Subjective:    Patient ID: Bethany [name], female    DOB: 01/14/1976, 38 y.o.   MRN: [id number]  HPI   Ms. Bethany [name] is a very pleasant 38 yr old female here with concern for strep throat.  Her daughter was diagnosed with a positive rapid strep at the pediatrician yesterday.  Pt reports sore throat began 4-5 days ago.  Worse on left than right.  Now with some left sided ear pain as well.  Has felt a little feverish but hasn't taken temp.  26F at triage here.  She denies congestion or cough.  Some HA.  Has been using pain medication and chloraseptic spray without much relief.     Review of Systems  Constitutional: Positive for fever (subjective). Negative for chills.  HENT: Positive for ear pain (left) and sore throat. Negative for congestion, rhinorrhea, trouble swallowing and voice change.   Respiratory: Negative for cough, shortness of breath and wheezing.   Cardiovascular: Negative.   Gastrointestinal: Negative.   Musculoskeletal: Negative.   Skin: Negative.   Neurological: Positive for headaches.       Objective:   Physical Exam  Vitals reviewed. Constitutional: She is oriented to person, place, and time. She appears well-developed and well-nourished. No distress.  HENT:  Head: Normocephalic and atraumatic.  Right Ear: Tympanic membrane and ear canal normal.  Left Ear: Tympanic membrane and ear canal normal.  Mouth/Throat: Uvula is midline and mucous membranes are normal. Oropharyngeal exudate (small amount, left tonsil), posterior oropharyngeal edema and posterior oropharyngeal erythema present. No tonsillar abscesses.  Eyes: Conjunctivae are normal. No scleral icterus.  Neck: Neck supple.  Cardiovascular: Normal rate, regular rhythm and normal heart sounds.   Pulmonary/Chest: Effort normal and breath sounds normal. She has no wheezes. She has no rales.  Abdominal: Soft. There is no tenderness.  Lymphadenopathy:    She has no cervical adenopathy.  Neurological: She is alert  and oriented to person, place, and time.  Skin: Skin is warm and dry.  Psychiatric: She has a normal mood and affect. Her behavior is normal.       Assessment & Plan:  Strep pharyngitis - Plan: penicillin v potassium (VEETID) 500 MG tablet, Diphenhyd-Hydrocort-Nystatin (FIRST-DUKES MOUTHWASH) SUSP  Sore throat   Ms. Bethany [name] is a very pleasant 38 yr old female here with acute pharyngitis - likely srep given her presentation and her daughter's positive strep infection.  Will treat empirically with pen vk 500mg  BID x 10 days.  Continue Tylenol/Advil for pain relief.  Magic Mouthwash q2h if needed.  Push fluids. New toothbrush.  Encourage pt to maintain good hand hygiene, not share food/drinks, etc to minimize spread of disease  Pt to call or RTC if worsening or not improving  E. Natividad Brood MHS, PA-C [facility] 4/18/20152:20 PM

## 2014-04-04 ENCOUNTER — Ambulatory Visit (INDEPENDENT_AMBULATORY_CARE_PROVIDER_SITE_OTHER): Payer: BC Managed Care – PPO | Admitting: Family Medicine

## 2014-04-04 VITALS — BP 124/84 | HR 72 | Temp 98.5°F | Resp 18 | Ht 66.5 in | Wt 222.0 lb

## 2014-04-04 DIAGNOSIS — I10 Essential (primary) hypertension: Secondary | ICD-10-CM

## 2014-04-04 MED ORDER — LOSARTAN POTASSIUM-HCTZ 100-12.5 MG PO TABS: 1.0000 | ORAL_TABLET | Freq: Every day | ORAL | Status: DC

## 2014-04-04 NOTE — Patient Instructions (Signed)
[employer]  (ginger, spices, vegetables) [employer] ([place] food:  Roti, Naan) [employer] or [employer] ([employer]) [employer] (chicken skewers) [place] (tabouli, hummus, [place])

## 2014-04-04 NOTE — Progress Notes (Signed)
38 year old woman with hypertension who comes in for recheck. She works as a Therapist, music.  She's been getting high blood pressure readings when she goes to the [facility] or to a pharmacy. Her left arm and right arm readings don't agree with each other.  Objective: HEENT unremarkable No acute distress Blood pressure is 140/100 in the left arm and 130/88 in the right arm. Patient has no edema Chest clear  heart is regular  Assessment: Patient is exercising more and trying to watch her diet better. I did receive to wait until January for which he any changes in her blood pressure medicine.  I spent 15 minutes talking about diet and I made suggestions of variations on her diet  Signed, Robyn Haber

## 2014-04-06 ENCOUNTER — Ambulatory Visit: Payer: BC Managed Care – PPO | Admitting: Family Medicine

## 2014-05-22 DIAGNOSIS — E282 Polycystic ovarian syndrome: Secondary | ICD-10-CM

## 2014-10-19 ENCOUNTER — Encounter: Payer: Self-pay | Admitting: Family Medicine

## 2015-01-09 ENCOUNTER — Inpatient Hospital Stay (HOSPITAL_COMMUNITY)
Admission: AD | Admit: 2015-01-09 | Discharge: 2015-01-09 | Disposition: A | Discharge status: Home / Self Care | Payer: BLUE CROSS/BLUE SHIELD | Source: Ambulatory Visit | Attending: Obstetrics and Gynecology | Admitting: Obstetrics and Gynecology

## 2015-01-09 ENCOUNTER — Encounter (HOSPITAL_COMMUNITY): Payer: Self-pay | Admitting: *Deleted

## 2015-01-09 DIAGNOSIS — Z3A22 22 weeks gestation of pregnancy: Secondary | ICD-10-CM | POA: Insufficient documentation

## 2015-01-09 DIAGNOSIS — O162 Unspecified maternal hypertension, second trimester: Secondary | ICD-10-CM | POA: Diagnosis not present

## 2015-01-09 LAB — COMPREHENSIVE METABOLIC PANEL
ALT: 27 U/L (ref 14–54)
AST: 25 U/L (ref 15–41)
Albumin: 3.2 g/dL — ABNORMAL LOW (ref 3.5–5.0)
Alkaline Phosphatase: 81 U/L (ref 38–126)
Anion gap: 5 (ref 5–15)
BUN: 6 mg/dL (ref 6–20)
CHLORIDE: 107 mmol/L (ref 101–111)
CO2: 22 mmol/L (ref 22–32)
CREATININE: 0.54 mg/dL (ref 0.44–1.00)
Calcium: 9.1 mg/dL (ref 8.9–10.3)
GFR calc Af Amer: >60 mL/min (ref >60)
GFR calc non Af Amer: >60 mL/min (ref >60)
Glucose, Bld: 102 mg/dL — ABNORMAL HIGH (ref 65–99)
POTASSIUM: 3.5 mmol/L (ref 3.5–5.1)
Sodium: 134 mmol/L — ABNORMAL LOW (ref 135–145)
Total Bilirubin: <0.1 mg/dL — ABNORMAL LOW (ref 0.3–1.2)
Total Protein: 6.6 g/dL (ref 6.5–8.1)

## 2015-01-09 LAB — URINALYSIS, ROUTINE W REFLEX MICROSCOPIC
BILIRUBIN URINE: NEGATIVE
GLUCOSE, UA: NEGATIVE mg/dL
Hgb urine dipstick: NEGATIVE
Ketones, ur: NEGATIVE mg/dL
Nitrite: NEGATIVE
PROTEIN: NEGATIVE mg/dL
SPECIFIC GRAVITY, URINE: 1.02 (ref 1.005–1.030)
UROBILINOGEN UA: 0.2 mg/dL (ref 0.0–1.0)
pH: 6 (ref 5.0–8.0)

## 2015-01-09 LAB — CBC
HCT: 34.4 % — ABNORMAL LOW (ref 36.0–46.0)
Hemoglobin: 12.3 g/dL (ref 12.0–15.0)
MCH: 33.3 pg (ref 26.0–34.0)
MCHC: 35.8 g/dL (ref 30.0–36.0)
MCV: 93.2 fL (ref 78.0–100.0)
PLATELETS: 228 10*3/uL (ref 150–400)
RBC: 3.69 MIL/uL — AB (ref 3.87–5.11)
RDW: 13.6 % (ref 11.5–15.5)
WBC: 15.6 10*3/uL — ABNORMAL HIGH (ref 4.0–10.5)

## 2015-01-09 LAB — URINE MICROSCOPIC-ADD ON

## 2015-01-09 LAB — PROTEIN / CREATININE RATIO, URINE
Creatinine, Urine: 91 mg/dL
Protein Creatinine Ratio: 0.16 mg/mg{Cre} — ABNORMAL HIGH (ref 0.00–0.15)
Total Protein, Urine: 15 mg/dL

## 2015-01-09 MED ORDER — LACTATED RINGERS IV SOLN
INTRAVENOUS | Status: DC
  Administered 2015-01-09: 19:00:00 via INTRAVENOUS

## 2015-01-09 MED ORDER — LABETALOL HCL 200 MG PO TABS: 200.0000 mg | ORAL_TABLET | Freq: Three times a day (TID) | ORAL | Status: DC

## 2015-01-09 MED ORDER — LABETALOL HCL 5 MG/ML IV SOLN
20.0000 mg | INTRAVENOUS | Status: DC | PRN
  Administered 2015-01-09: 20 mg via INTRAVENOUS
  Filled 2015-01-09: qty 4

## 2015-01-09 NOTE — MAU Note (Addendum)
Came from Dr's office, hx of CHTN.  Was put on Labetalol 2 wks ago.  BP still up, sent in for further eval ( blood work and serial BP's). Denies HA, visual changes or epigastric pain. Hx of pre-eclampsia

## 2015-01-09 NOTE — MAU Note (Signed)
Sent from [place] office for [place] eval; hx of preeclampsia with last pregnancy;

## 2015-01-09 NOTE — MAU Provider Note (Signed)
History     CSN: [id number]  Arrival date and time: 01/09/15 1541   First Provider Initiated Contact with Patient 01/09/15 1755      Chief Complaint  Patient presents with  . Hypertension   Hypertension This is a chronic problem. The current episode started today. The problem is unchanged. Associated symptoms include peripheral edema. Pertinent negatives include no anxiety, blurred vision, chest pain, headaches or malaise/fatigue. There are no associated agents to hypertension. Risk factors for coronary artery disease include obesity. Past treatments include beta blockers and diuretics. There are no compliance problems.  There is no history of angina, CVA or heart failure.   This is a 39 y.o. female at 21w1dwho presents from office for evaluation of hypertension and to rule out preeclampsia. Has a history for preeclampsia in last pregnancy.  C/O headache yesterday, but not today. Little edema. No abdominal pain  RN note: [facility]; hx of preeclampsia with last pregnancy;          OB History    Gravida Para Term Preterm AB TAB SAB Ectopic Multiple Living   3 2 1 1      2       Past Medical History  Diagnosis Date  . GERD (gastroesophageal reflux disease)   . Hypertension     Past Surgical History  Procedure Laterality Date  . Hernia repair      Family History  Problem Relation Age of Onset  . Hypertension Mother   . Cancer Father   . Diabetes Father   . Hypertension Father   . Diabetes Brother   . Kidney failure Brother   . Heart attack Paternal Grandmother   . Heart attack Paternal Grandfather     History  Substance Use Topics  . Smoking status: Never Smoker   . Smokeless tobacco: Not on file  . Alcohol Use: No    Allergies: No Known Allergies  Prescriptions prior to admission  Medication Sig Dispense Refill Last Dose  . acetaminophen (TYLENOL) 500 MG tablet Take 500 mg by mouth every 6 (six) hours as needed for moderate  pain or headache.   Past Week at Unknown time  . ARGININE PO Take 1 tablet by mouth daily.   01/08/2015 at Unknown time  . calcium carbonate (TUMS - DOSED IN MG ELEMENTAL CALCIUM) 500 MG chewable tablet Chew 2 tablets by mouth as needed for indigestion or heartburn.   01/09/2015 at Unknown time  . cholecalciferol (VITAMIN D) 1000 UNITS tablet Take 1,000 Units by mouth daily.   01/09/2015 at Unknown time  . labetalol (NORMODYNE) 200 MG tablet Take 200 mg by mouth 2 (two) times daily.   01/09/2015 at 0700  . loratadine (CLARITIN) 10 MG tablet Take 10 mg by mouth daily as needed for allergies.   01/08/2015 at Unknown time  . Prenatal Vit-Fe Fumarate-FA (MULTIVITAMIN-PRENATAL) 27-0.8 MG TABS tablet Take 1 tablet by mouth daily at 12 noon.   01/09/2015 at Unknown time  . Tetrahydrozoline HCl (VISINE OP) Apply 2 drops to eye daily.   01/08/2015 at Unknown time  . losartan-hydrochlorothiazide (HYZAAR) 100-12.5 MG per tablet Take 1 tablet by mouth daily. (Patient not taking: Reported on 01/09/2015) 90 tablet 3     Review of Systems  Constitutional: Negative for fever, chills and malaise/fatigue.  Eyes: Negative for blurred vision and double vision.  Respiratory: Negative for cough.   Cardiovascular: Negative for chest pain.  Gastrointestinal: Negative for nausea, vomiting, abdominal pain, diarrhea and  constipation.  Neurological: Negative for dizziness, sensory change, speech change, focal weakness, weakness and headaches.   Physical Exam   Blood pressure 168/104, pulse 77, temperature 98.6 F (37 C), temperature source Oral, resp. rate 18, weight 249 lb (112.946 kg), last menstrual period 03/04/2014.  Physical Exam  Constitutional: She is oriented to person, place, and time. She appears well-developed and well-nourished. No distress.  HENT:  Head: Normocephalic.  Neck: Normal range of motion. Neck supple.  Cardiovascular: Normal rate, regular rhythm and normal heart sounds.  Exam reveals no gallop and no  friction rub.   No murmur heard. Respiratory: Effort normal and breath sounds normal. No respiratory distress. She has no wheezes. She has no rales. She exhibits no tenderness.  GI: Soft. She exhibits no distension and no mass. There is no tenderness. There is no rebound and no guarding.  Musculoskeletal: Normal range of motion. She exhibits edema (trace).  Neurological: She is alert and oriented to person, place, and time.  Skin: Skin is warm and dry.  Psychiatric: She has a normal mood and affect.   FHR 155  MAU Course  Procedures Results for orders placed or performed during the hospital encounter of 01/09/15 (from the past 72 hour(s))  Urinalysis, Routine w reflex microscopic (not at [facility])     Status: Abnormal   Collection Time: 01/09/15  4:05 PM  Result Value Ref Range   Color, Urine YELLOW YELLOW   APPearance CLEAR CLEAR   Specific Gravity, Urine 1.020 1.005 - 1.030   pH 6.0 5.0 - 8.0   Glucose, UA NEGATIVE NEGATIVE mg/dL   Hgb urine dipstick NEGATIVE NEGATIVE   Bilirubin Urine NEGATIVE NEGATIVE   Ketones, ur NEGATIVE NEGATIVE mg/dL   Protein, ur NEGATIVE NEGATIVE mg/dL   Urobilinogen, UA 0.2 0.0 - 1.0 mg/dL   Nitrite NEGATIVE NEGATIVE   Leukocytes, UA SMALL (A) NEGATIVE  Protein / creatinine ratio, urine     Status: Abnormal   Collection Time: 01/09/15  4:05 PM  Result Value Ref Range   Creatinine, Urine 91.00 mg/dL   Total Protein, Urine 15 mg/dL    Comment: NO NORMAL RANGE ESTABLISHED FOR THIS TEST   Protein Creatinine Ratio 0.16 (H) 0.00 - 0.15 mg/mg[Cre]  Urine microscopic-add on     Status: Abnormal   Collection Time: 01/09/15  4:05 PM  Result Value Ref Range   Squamous Epithelial / LPF MANY (A) RARE   WBC, UA 3-6 <3 WBC/hpf   RBC / HPF 0-2 <3 RBC/hpf   Bacteria, UA FEW (A) RARE   Urine-Other MUCOUS PRESENT   CBC     Status: Abnormal   Collection Time: 01/09/15  4:45 PM  Result Value Ref Range   WBC 15.6 (H) 4.0 - 10.5 K/uL   RBC 3.69 (L) 3.87 - 5.11 MIL/uL    Hemoglobin 12.3 12.0 - 15.0 g/dL   HCT 34.4 (L) 36.0 - 46.0 %   MCV 93.2 78.0 - 100.0 fL   MCH 33.3 26.0 - 34.0 pg   MCHC 35.8 30.0 - 36.0 g/dL   RDW 13.6 11.5 - 15.5 %   Platelets 228 150 - 400 K/uL  Comprehensive metabolic panel     Status: Abnormal   Collection Time: 01/09/15  4:45 PM  Result Value Ref Range   Sodium 134 (L) 135 - 145 mmol/L   Potassium 3.5 3.5 - 5.1 mmol/L   Chloride 107 101 - 111 mmol/L   CO2 22 22 - 32 mmol/L   Glucose, Bld 102 (H)  65 - 99 mg/dL   BUN 6 6 - 20 mg/dL   Creatinine, Ser 0.54 0.44 - 1.00 mg/dL   Calcium 9.1 8.9 - 10.3 mg/dL   Total Protein 6.6 6.5 - 8.1 g/dL   Albumin 3.2 (L) 3.5 - 5.0 g/dL   AST 25 15 - 41 U/L   ALT 27 14 - 54 U/L   Alkaline Phosphatase 81 38 - 126 U/L   Total Bilirubin <0.1 (L) 0.3 - 1.2 mg/dL    Comment: REPEATED TO VERIFY   GFR calc non Af Amer >60 >60 mL/min   GFR calc Af Amer >60 >60 mL/min    Comment: (NOTE) The eGFR has been calculated using the CKD EPI equation. This calculation has not been validated in all clinical situations. eGFR's persistently <60 mL/min signify possible Chronic Kidney Disease.    Anion gap 5 5 - 15   MDM Consulted Dr. Ulanda Edison.  He ordered Labetalol IV  And repeat as needed  Labetalol 77m IV x 1 BPs improved149/99 mmHg            147/93 mmHg               150/96 mmHg       150/94 mmHg  Reconsulted Dr HUlanda Edisonand reported results and BP improvement  Assessment and Plan  A:  SIUP at 288w1d      Hypertension, severe       History of chronic hypertension?      Improvement of BP after one dose      Labs normal  P  Discharge home per Dr HeUlanda Edison    We will increase Labetalol frequency to TID from BID      Preeclampsia precautions      Keep office appointment for OB care and follow up  [facility]/02/2015, 6:31 PM

## 2015-01-09 NOTE — Discharge Instructions (Signed)

## 2015-01-29 ENCOUNTER — Inpatient Hospital Stay (HOSPITAL_COMMUNITY): Payer: BLUE CROSS/BLUE SHIELD | Admitting: Anesthesiology

## 2015-01-29 ENCOUNTER — Encounter (HOSPITAL_COMMUNITY)
Admission: AD | Disposition: A | Discharge status: Home / Self Care | Payer: Self-pay | Source: Ambulatory Visit | Attending: Obstetrics and Gynecology

## 2015-01-29 ENCOUNTER — Inpatient Hospital Stay (HOSPITAL_COMMUNITY): Payer: BLUE CROSS/BLUE SHIELD

## 2015-01-29 ENCOUNTER — Inpatient Hospital Stay (HOSPITAL_COMMUNITY)
Admission: AD | Admit: 2015-01-29 | Discharge: 2015-02-01 | DRG: 765 | Disposition: A | Discharge status: Home / Self Care | Payer: BLUE CROSS/BLUE SHIELD | Source: Ambulatory Visit | Attending: Obstetrics and Gynecology | Admitting: Obstetrics and Gynecology

## 2015-01-29 ENCOUNTER — Encounter (HOSPITAL_COMMUNITY): Payer: Self-pay | Admitting: *Deleted

## 2015-01-29 DIAGNOSIS — O3412 Maternal care for benign tumor of corpus uteri, second trimester: Secondary | ICD-10-CM | POA: Diagnosis present

## 2015-01-29 DIAGNOSIS — Z3689 Encounter for other specified antenatal screening: Secondary | ICD-10-CM | POA: Insufficient documentation

## 2015-01-29 DIAGNOSIS — O9902 Anemia complicating childbirth: Secondary | ICD-10-CM | POA: Diagnosis present

## 2015-01-29 DIAGNOSIS — Z3A24 24 weeks gestation of pregnancy: Secondary | ICD-10-CM

## 2015-01-29 DIAGNOSIS — D259 Leiomyoma of uterus, unspecified: Secondary | ICD-10-CM | POA: Insufficient documentation

## 2015-01-29 DIAGNOSIS — D62 Acute posthemorrhagic anemia: Secondary | ICD-10-CM

## 2015-01-29 DIAGNOSIS — IMO0002 Reserved for concepts with insufficient information to code with codable children: Secondary | ICD-10-CM

## 2015-01-29 DIAGNOSIS — O1410 Severe pre-eclampsia, unspecified trimester: Secondary | ICD-10-CM

## 2015-01-29 DIAGNOSIS — O1092 Unspecified pre-existing hypertension complicating childbirth: Secondary | ICD-10-CM | POA: Diagnosis present

## 2015-01-29 DIAGNOSIS — O09523 Supervision of elderly multigravida, third trimester: Secondary | ICD-10-CM | POA: Diagnosis not present

## 2015-01-29 DIAGNOSIS — I10 Essential (primary) hypertension: Secondary | ICD-10-CM

## 2015-01-29 DIAGNOSIS — O904 Postpartum acute kidney failure: Secondary | ICD-10-CM | POA: Diagnosis present

## 2015-01-29 DIAGNOSIS — O1412 Severe pre-eclampsia, second trimester: Secondary | ICD-10-CM | POA: Diagnosis not present

## 2015-01-29 DIAGNOSIS — O4592 Premature separation of placenta, unspecified, second trimester: Principal | ICD-10-CM | POA: Diagnosis present

## 2015-01-29 DIAGNOSIS — N179 Acute kidney failure, unspecified: Secondary | ICD-10-CM

## 2015-01-29 DIAGNOSIS — O3413 Maternal care for benign tumor of corpus uteri, third trimester: Secondary | ICD-10-CM | POA: Diagnosis present

## 2015-01-29 DIAGNOSIS — O4590 Premature separation of placenta, unspecified, unspecified trimester: Secondary | ICD-10-CM

## 2015-01-29 DIAGNOSIS — Z98891 History of uterine scar from previous surgery: Secondary | ICD-10-CM

## 2015-01-29 LAB — COMPREHENSIVE METABOLIC PANEL
ALT: 25 U/L (ref 14–54)
ALT: 23 U/L (ref 14–54)
AST: 48 U/L — ABNORMAL HIGH (ref 15–41)
AST: 42 U/L — ABNORMAL HIGH (ref 15–41)
Albumin: 3.1 g/dL — ABNORMAL LOW (ref 3.5–5.0)
Albumin: 2.6 g/dL — ABNORMAL LOW (ref 3.5–5.0)
Alkaline Phosphatase: 93 U/L (ref 38–126)
Alkaline Phosphatase: 80 U/L (ref 38–126)
Anion gap: 6 (ref 5–15)
Anion gap: 8 (ref 5–15)
BUN: 15 mg/dL (ref 6–20)
BUN: 21 mg/dL — AB (ref 6–20)
CALCIUM: 8.4 mg/dL — AB (ref 8.9–10.3)
CO2: 21 mmol/L — ABNORMAL LOW (ref 22–32)
CO2: 21 mmol/L — ABNORMAL LOW (ref 22–32)
CREATININE: 1.35 mg/dL — AB (ref 0.44–1.00)
Calcium: 9.1 mg/dL (ref 8.9–10.3)
Chloride: 105 mmol/L (ref 101–111)
Chloride: 105 mmol/L (ref 101–111)
Creatinine, Ser: 1.01 mg/dL — ABNORMAL HIGH (ref 0.44–1.00)
GFR calc Af Amer: >60 mL/min (ref >60)
GFR calc non Af Amer: >60 mL/min (ref >60)
GFR, EST AFRICAN AMERICAN: 57 mL/min — AB (ref >60)
GFR, EST NON AFRICAN AMERICAN: 49 mL/min — AB (ref >60)
GLUCOSE: 101 mg/dL — AB (ref 65–99)
Glucose, Bld: 85 mg/dL (ref 65–99)
POTASSIUM: 5 mmol/L (ref 3.5–5.1)
Potassium: 4.6 mmol/L (ref 3.5–5.1)
Sodium: 132 mmol/L — ABNORMAL LOW (ref 135–145)
Sodium: 134 mmol/L — ABNORMAL LOW (ref 135–145)
Total Bilirubin: 1.1 mg/dL (ref 0.3–1.2)
Total Bilirubin: 0.9 mg/dL (ref 0.3–1.2)
Total Protein: 6.5 g/dL (ref 6.5–8.1)
Total Protein: 5.3 g/dL — ABNORMAL LOW (ref 6.5–8.1)

## 2015-01-29 LAB — CBC WITH DIFFERENTIAL/PLATELET
BAND NEUTROPHILS: 0 % (ref 0–10)
BASOS ABS: 0.3 10*3/uL — AB (ref 0.0–0.1)
BLASTS: 0 %
Basophils Relative: 1 % (ref 0–1)
Eosinophils Absolute: 0.3 10*3/uL (ref 0.0–0.7)
Eosinophils Relative: 1 % (ref 0–5)
HCT: 29.3 % — ABNORMAL LOW (ref 36.0–46.0)
Hemoglobin: 10.4 g/dL — ABNORMAL LOW (ref 12.0–15.0)
Lymphocytes Relative: 2 % — ABNORMAL LOW (ref 12–46)
Lymphs Abs: 0.6 10*3/uL — ABNORMAL LOW (ref 0.7–4.0)
MCH: 33.4 pg (ref 26.0–34.0)
MCHC: 35.5 g/dL (ref 30.0–36.0)
MCV: 94.2 fL (ref 78.0–100.0)
MONOS PCT: 1 % — AB (ref 3–12)
Metamyelocytes Relative: 0 %
Monocytes Absolute: 0.3 10*3/uL (ref 0.1–1.0)
Myelocytes: 0 %
NEUTROS ABS: 27.4 10*3/uL — AB (ref 1.7–7.7)
Neutrophils Relative %: 95 % — ABNORMAL HIGH (ref 43–77)
Other: 0 %
Platelets: 133 10*3/uL — ABNORMAL LOW (ref 150–400)
Promyelocytes Absolute: 0 %
RBC: 3.11 MIL/uL — ABNORMAL LOW (ref 3.87–5.11)
RDW: 13.9 % (ref 11.5–15.5)
WBC: 28.9 10*3/uL — AB (ref 4.0–10.5)
nRBC: 0 /100 WBC

## 2015-01-29 LAB — PROTEIN / CREATININE RATIO, URINE
Creatinine, Urine: 96 mg/dL
Protein Creatinine Ratio: 5.79 mg/mg{Cre} — ABNORMAL HIGH (ref 0.00–0.15)
TOTAL PROTEIN, URINE: 556 mg/dL

## 2015-01-29 LAB — CBC
HCT: 36.3 % (ref 36.0–46.0)
Hemoglobin: 12.7 g/dL (ref 12.0–15.0)
MCH: 33.2 pg (ref 26.0–34.0)
MCHC: 35 g/dL (ref 30.0–36.0)
MCV: 95 fL (ref 78.0–100.0)
Platelets: 162 10*3/uL (ref 150–400)
RBC: 3.82 MIL/uL — ABNORMAL LOW (ref 3.87–5.11)
RDW: 13.6 % (ref 11.5–15.5)
WBC: 22.4 10*3/uL — ABNORMAL HIGH (ref 4.0–10.5)

## 2015-01-29 LAB — LACTATE DEHYDROGENASE: LDH: 457 U/L — ABNORMAL HIGH (ref 98–192)

## 2015-01-29 LAB — FIBRINOGEN: Fibrinogen: 97 mg/dL — CL (ref 204–475)

## 2015-01-29 LAB — ABO/RH: ABO/RH(D): O POS

## 2015-01-29 LAB — PREPARE RBC (CROSSMATCH)

## 2015-01-29 LAB — URIC ACID: URIC ACID, SERUM: 5.4 mg/dL (ref 2.3–6.6)

## 2015-01-29 SURGERY — Surgical Case
Anesthesia: General

## 2015-01-29 MED ORDER — LABETALOL HCL 5 MG/ML IV SOLN: 20.0000 mg | INTRAVENOUS | Status: DC | PRN

## 2015-01-29 MED ORDER — MEASLES, MUMPS & RUBELLA VAC ~~LOC~~ INJ: 0.5000 mL | INJECTION | Freq: Once | SUBCUTANEOUS | Status: DC

## 2015-01-29 MED ORDER — CEFAZOLIN SODIUM-DEXTROSE 2-3 GM-% IV SOLR
INTRAVENOUS | Status: DC | PRN
  Administered 2015-01-29: 2 g via INTRAVENOUS

## 2015-01-29 MED ORDER — OXYCODONE-ACETAMINOPHEN 5-325 MG PO TABS: 1.0000 | ORAL_TABLET | ORAL | Status: DC | PRN

## 2015-01-29 MED ORDER — LACTATED RINGERS IV SOLN
INTRAVENOUS | Status: DC | PRN
  Administered 2015-01-29: 18:00:00 via INTRAVENOUS

## 2015-01-29 MED ORDER — SCOPOLAMINE 1 MG/3DAYS TD PT72
MEDICATED_PATCH | TRANSDERMAL | Status: AC
Start: 2015-01-29 — End: 2015-01-29
  Filled 2015-01-29: qty 1

## 2015-01-29 MED ORDER — MAGNESIUM SULFATE 40 G IN LACTATED RINGERS - SIMPLE
INTRAVENOUS | Status: DC | PRN
  Administered 2015-01-29: 2 g/h via INTRAVENOUS

## 2015-01-29 MED ORDER — ONDANSETRON HCL 4 MG/2ML IJ SOLN
4.0000 mg | Freq: Once | INTRAMUSCULAR | Status: AC
  Administered 2015-01-29: 4 mg via INTRAVENOUS
  Filled 2015-01-29: qty 2

## 2015-01-29 MED ORDER — METOCLOPRAMIDE HCL 5 MG/ML IJ SOLN: 10.0000 mg | Freq: Once | INTRAMUSCULAR | Status: DC | PRN

## 2015-01-29 MED ORDER — FENTANYL CITRATE (PF) 100 MCG/2ML IJ SOLN
INTRAMUSCULAR | Status: DC | PRN
  Administered 2015-01-29: 100 ug via INTRAVENOUS
  Administered 2015-01-29: 50 ug via INTRAVENOUS
  Administered 2015-01-29: 100 ug via INTRAVENOUS

## 2015-01-29 MED ORDER — BETAMETHASONE SOD PHOS & ACET 6 (3-3) MG/ML IJ SUSP
12.0000 mg | INTRAMUSCULAR | Status: DC
  Filled 2015-01-29 (×2): qty 2

## 2015-01-29 MED ORDER — MAGNESIUM SULFATE 50 % IJ SOLN
2.0000 g/h | INTRAVENOUS | Status: DC
  Administered 2015-01-30: 2 g/h via INTRAVENOUS
  Filled 2015-01-29 (×2): qty 80

## 2015-01-29 MED ORDER — OXYTOCIN 10 UNIT/ML IJ SOLN
INTRAMUSCULAR | Status: AC
  Filled 2015-01-29: qty 4

## 2015-01-29 MED ORDER — LABETALOL HCL 5 MG/ML IV SOLN
80.0000 mg | Freq: Once | INTRAVENOUS | Status: AC
  Administered 2015-01-29: 80 mg via INTRAVENOUS
  Filled 2015-01-29: qty 16

## 2015-01-29 MED ORDER — LACTATED RINGERS IV SOLN: INTRAVENOUS | Status: DC

## 2015-01-29 MED ORDER — SENNOSIDES-DOCUSATE SODIUM 8.6-50 MG PO TABS
2.0000 | ORAL_TABLET | ORAL | Status: DC
  Administered 2015-01-30 – 2015-01-31 (×2): 2 via ORAL
  Filled 2015-01-29 (×3): qty 2

## 2015-01-29 MED ORDER — ACETAMINOPHEN 325 MG PO TABS: 650.0000 mg | ORAL_TABLET | ORAL | Status: DC | PRN

## 2015-01-29 MED ORDER — OXYTOCIN 10 UNIT/ML IJ SOLN
40.0000 [IU] | INTRAVENOUS | Status: DC | PRN
  Administered 2015-01-29: 40 [IU] via INTRAVENOUS

## 2015-01-29 MED ORDER — LABETALOL HCL 5 MG/ML IV SOLN
INTRAVENOUS | Status: AC
  Filled 2015-01-29: qty 8

## 2015-01-29 MED ORDER — HYDROMORPHONE HCL 1 MG/ML IJ SOLN
INTRAMUSCULAR | Status: AC
  Administered 2015-01-29: 0.25 mg via INTRAVENOUS
  Filled 2015-01-29: qty 1

## 2015-01-29 MED ORDER — PROPOFOL 10 MG/ML IV BOLUS
INTRAVENOUS | Status: DC | PRN
  Administered 2015-01-29: 200 mg via INTRAVENOUS

## 2015-01-29 MED ORDER — DIBUCAINE 1 % RE OINT: 1.0000 "application " | TOPICAL_OINTMENT | RECTAL | Status: DC | PRN

## 2015-01-29 MED ORDER — MAGNESIUM SULFATE BOLUS VIA INFUSION: 2.0000 g/h | Freq: Once | INTRAVENOUS | Status: DC

## 2015-01-29 MED ORDER — HYDROMORPHONE HCL 1 MG/ML IJ SOLN
INTRAMUSCULAR | Status: DC | PRN
  Administered 2015-01-29: 1 mg via INTRAVENOUS

## 2015-01-29 MED ORDER — WITCH HAZEL-GLYCERIN EX PADS
1.0000 | MEDICATED_PAD | CUTANEOUS | Status: DC | PRN
Start: 2015-01-29 — End: 2015-02-01

## 2015-01-29 MED ORDER — LACTATED RINGERS IV SOLN
INTRAVENOUS | Status: DC
  Administered 2015-01-29 – 2015-01-30 (×3): via INTRAVENOUS

## 2015-01-29 MED ORDER — CEFAZOLIN SODIUM-DEXTROSE 2-3 GM-% IV SOLR
INTRAVENOUS | Status: AC
Start: 2015-01-29 — End: 2015-01-29
  Filled 2015-01-29: qty 50

## 2015-01-29 MED ORDER — ONDANSETRON HCL 4 MG/2ML IJ SOLN
INTRAMUSCULAR | Status: AC
  Filled 2015-01-29: qty 2

## 2015-01-29 MED ORDER — LANOLIN HYDROUS EX OINT: 1.0000 "application " | TOPICAL_OINTMENT | CUTANEOUS | Status: DC | PRN

## 2015-01-29 MED ORDER — CEFAZOLIN SODIUM-DEXTROSE 2-3 GM-% IV SOLR
2.0000 g | Freq: Three times a day (TID) | INTRAVENOUS | Status: AC
  Administered 2015-01-30 (×2): 2 g via INTRAVENOUS
  Filled 2015-01-29 (×2): qty 50

## 2015-01-29 MED ORDER — HYDROMORPHONE HCL 1 MG/ML IJ SOLN
0.2500 mg | INTRAMUSCULAR | Status: DC | PRN
  Administered 2015-01-29: 0.25 mg via INTRAVENOUS
  Administered 2015-01-29: 0.5 mg via INTRAVENOUS
  Administered 2015-01-29: 0.25 mg via INTRAVENOUS

## 2015-01-29 MED ORDER — HYDROMORPHONE HCL 1 MG/ML IJ SOLN
INTRAMUSCULAR | Status: AC
  Filled 2015-01-29: qty 1

## 2015-01-29 MED ORDER — FENTANYL CITRATE (PF) 250 MCG/5ML IJ SOLN
INTRAMUSCULAR | Status: AC
  Filled 2015-01-29: qty 5

## 2015-01-29 MED ORDER — MENTHOL 3 MG MT LOZG: 1.0000 | LOZENGE | OROMUCOSAL | Status: DC | PRN

## 2015-01-29 MED ORDER — TETANUS-DIPHTH-ACELL PERTUSSIS 5-2.5-18.5 LF-MCG/0.5 IM SUSP: 0.5000 mL | Freq: Once | INTRAMUSCULAR | Status: DC

## 2015-01-29 MED ORDER — SUCCINYLCHOLINE CHLORIDE 200 MG/10ML IV SOSY
PREFILLED_SYRINGE | INTRAVENOUS | Status: DC | PRN
  Administered 2015-01-29: 180 mg via INTRAVENOUS

## 2015-01-29 MED ORDER — OXYCODONE-ACETAMINOPHEN 5-325 MG PO TABS: 2.0000 | ORAL_TABLET | ORAL | Status: DC | PRN

## 2015-01-29 MED ORDER — OXYTOCIN 40 UNITS IN LACTATED RINGERS INFUSION - SIMPLE MED
62.5000 mL/h | INTRAVENOUS | Status: AC
  Administered 2015-01-29: 62.5 mL/h via INTRAVENOUS
  Filled 2015-01-29: qty 1000

## 2015-01-29 MED ORDER — LACTATED RINGERS IV SOLN
INTRAVENOUS | Status: DC | PRN
  Administered 2015-01-29 (×2): via INTRAVENOUS

## 2015-01-29 MED ORDER — BETAMETHASONE SOD PHOS & ACET 6 (3-3) MG/ML IJ SUSP: 12.0000 mg | Freq: Once | INTRAMUSCULAR | Status: DC

## 2015-01-29 MED ORDER — IBUPROFEN 600 MG PO TABS
600.0000 mg | ORAL_TABLET | Freq: Four times a day (QID) | ORAL | Status: DC
Start: 2015-01-29 — End: 2015-02-01
  Administered 2015-01-30 – 2015-02-01 (×8): 600 mg via ORAL
  Filled 2015-01-29 (×8): qty 1

## 2015-01-29 MED ORDER — MIDAZOLAM HCL 5 MG/5ML IJ SOLN
INTRAMUSCULAR | Status: DC | PRN
  Administered 2015-01-29: 2 mg via INTRAVENOUS

## 2015-01-29 MED ORDER — MEPERIDINE HCL 25 MG/ML IJ SOLN: 6.2500 mg | INTRAMUSCULAR | Status: DC | PRN

## 2015-01-29 MED ORDER — ZOLPIDEM TARTRATE 5 MG PO TABS: 5.0000 mg | ORAL_TABLET | Freq: Every evening | ORAL | Status: DC | PRN

## 2015-01-29 MED ORDER — SIMETHICONE 80 MG PO CHEW
80.0000 mg | CHEWABLE_TABLET | ORAL | Status: DC | PRN
  Administered 2015-01-31: 80 mg via ORAL
  Filled 2015-01-29: qty 1

## 2015-01-29 MED ORDER — LABETALOL HCL 5 MG/ML IV SOLN
40.0000 mg | Freq: Once | INTRAVENOUS | Status: AC
  Administered 2015-01-29: 40 mg via INTRAVENOUS

## 2015-01-29 MED ORDER — SIMETHICONE 80 MG PO CHEW
80.0000 mg | CHEWABLE_TABLET | Freq: Three times a day (TID) | ORAL | Status: DC
Start: 2015-01-30 — End: 2015-02-01
  Administered 2015-01-30 – 2015-02-01 (×8): 80 mg via ORAL
  Filled 2015-01-29 (×9): qty 1

## 2015-01-29 MED ORDER — LIDOCAINE HCL (CARDIAC) 20 MG/ML IV SOLN
INTRAVENOUS | Status: DC | PRN
  Administered 2015-01-29: 50 mg via INTRAVENOUS

## 2015-01-29 MED ORDER — SIMETHICONE 80 MG PO CHEW
80.0000 mg | CHEWABLE_TABLET | ORAL | Status: DC
  Administered 2015-01-30 – 2015-02-01 (×3): 80 mg via ORAL
  Filled 2015-01-29 (×3): qty 1

## 2015-01-29 MED ORDER — SODIUM CHLORIDE 0.9 % IR SOLN
Status: DC | PRN
  Administered 2015-01-29 (×2): 1

## 2015-01-29 MED ORDER — PROPOFOL 10 MG/ML IV BOLUS
INTRAVENOUS | Status: AC
  Filled 2015-01-29: qty 40

## 2015-01-29 MED ORDER — MIDAZOLAM HCL 2 MG/2ML IJ SOLN
INTRAMUSCULAR | Status: AC
  Filled 2015-01-29: qty 2

## 2015-01-29 MED ORDER — MAGNESIUM SULFATE BOLUS VIA INFUSION
4.0000 g | Freq: Once | INTRAVENOUS | Status: AC
  Administered 2015-01-29: 4 g via INTRAVENOUS
  Filled 2015-01-29: qty 500

## 2015-01-29 MED ORDER — DIPHENHYDRAMINE HCL 25 MG PO CAPS: 25.0000 mg | ORAL_CAPSULE | Freq: Four times a day (QID) | ORAL | Status: DC | PRN

## 2015-01-29 MED ORDER — HYDRALAZINE HCL 20 MG/ML IJ SOLN
10.0000 mg | Freq: Once | INTRAMUSCULAR | Status: AC | PRN
  Administered 2015-01-29: 10 mg via INTRAVENOUS
  Filled 2015-01-29: qty 1

## 2015-01-29 MED ORDER — SUCCINYLCHOLINE CHLORIDE 20 MG/ML IJ SOLN
INTRAMUSCULAR | Status: AC
  Filled 2015-01-29: qty 1

## 2015-01-29 MED ORDER — LABETALOL HCL 5 MG/ML IV SOLN
20.0000 mg | Freq: Once | INTRAVENOUS | Status: AC
  Administered 2015-01-29: 20 mg via INTRAVENOUS
  Filled 2015-01-29: qty 4

## 2015-01-29 MED ORDER — SCOPOLAMINE 1 MG/3DAYS TD PT72
MEDICATED_PATCH | TRANSDERMAL | Status: DC | PRN
  Administered 2015-01-29: 1 via TRANSDERMAL

## 2015-01-29 SURGICAL SUPPLY — 32 items
CLAMP CORD UMBIL (MISCELLANEOUS) IMPLANT
CLOTH BEACON ORANGE TIMEOUT ST (SAFETY) ×2 IMPLANT
CONTAINER PREFILL 10% NBF 15ML (MISCELLANEOUS) IMPLANT
DRAPE SHEET LG 3/4 BI-LAMINATE (DRAPES) IMPLANT
DRSG OPSITE POSTOP 4X10 (GAUZE/BANDAGES/DRESSINGS) ×2 IMPLANT
DRSG VASELINE 3X18 (GAUZE/BANDAGES/DRESSINGS) ×2 IMPLANT
DURAPREP 26ML APPLICATOR (WOUND CARE) ×2 IMPLANT
ELECT REM PT RETURN 9FT ADLT (ELECTROSURGICAL) ×2
ELECTRODE REM PT RTRN 9FT ADLT (ELECTROSURGICAL) ×1 IMPLANT
EXTRACTOR VACUUM KIWI (MISCELLANEOUS) IMPLANT
EXTRACTOR VACUUM M CUP 4 TUBE (SUCTIONS) IMPLANT
GAUZE CURAFIL 4X4 (GAUZE/BANDAGES/DRESSINGS) ×2 IMPLANT
GAUZE PETROLATUM 7835 (GAUZE/BANDAGES/DRESSINGS) ×2 IMPLANT
GLOVE BIO SURGEON STRL SZ7.5 (GLOVE) ×2 IMPLANT
GOWN STRL REUS W/TWL LRG LVL3 (GOWN DISPOSABLE) ×4 IMPLANT
KIT ABG SYR 3ML LUER SLIP (SYRINGE) IMPLANT
NEEDLE HYPO 25X5/8 SAFETYGLIDE (NEEDLE) IMPLANT
NS IRRIG 1000ML POUR BTL (IV SOLUTION) ×2 IMPLANT
PACK C SECTION WH (CUSTOM PROCEDURE TRAY) ×2 IMPLANT
PAD OB MATERNITY 4.3X12.25 (PERSONAL CARE ITEMS) ×2 IMPLANT
RTRCTR C-SECT PINK 25CM LRG (MISCELLANEOUS) IMPLANT
STAPLER VISISTAT 35W (STAPLE) ×2 IMPLANT
SUT PLAIN 0 NONE (SUTURE) IMPLANT
SUT VIC AB 0 CT1 36 (SUTURE) ×16 IMPLANT
SUT VIC AB 3-0 CTX 36 (SUTURE) ×2 IMPLANT
SUT VIC AB 3-0 SH 27 (SUTURE)
SUT VIC AB 3-0 SH 27X BRD (SUTURE) IMPLANT
SUT VIC AB 4-0 KS 27 (SUTURE) IMPLANT
SUT VICRYL 0 TIES 12 18 (SUTURE) IMPLANT
TAPE CLOTH SURG 4X10 WHT LF (GAUZE/BANDAGES/DRESSINGS) ×2 IMPLANT
TOWEL OR 17X24 6PK STRL BLUE (TOWEL DISPOSABLE) ×2 IMPLANT
TRAY FOLEY CATH SILVER 14FR (SET/KITS/TRAYS/PACK) ×2 IMPLANT

## 2015-01-29 NOTE — MAU Note (Signed)
Altamease Oiler NP in with sonosite U/S to determine FHR.

## 2015-01-29 NOTE — Anesthesia Postprocedure Evaluation (Signed)
  Anesthesia Post-op Note  Patient: Bethany [name]  Procedure(s) Performed: Procedure(s) (LRB): CESAREAN SECTION (N/A)  Patient Location: PACU  Anesthesia Type: General  Level of Consciousness: awake and alert   Airway and Oxygen Therapy: Patient Spontanous Breathing  Post-op Pain: mild  Post-op Assessment: Post-op Vital signs reviewed, Patient's Cardiovascular Status Stable, Respiratory Function Stable, Patent Airway and No signs of Nausea or vomiting  Last Vitals:  Filed Vitals:   01/29/15 1945  BP: 132/79  Pulse: 79  Temp:   Resp: 20    Post-op Vital Signs: stable   Complications: No apparent anesthesia complications

## 2015-01-29 NOTE — MAU Provider Note (Signed)
History     CSN: [id number]  Arrival date and time: 01/29/15 1610   None     Chief Complaint  Patient presents with  . High Blood Pressure    HPI    Ms. Bethany [name] is a 39 y.o. female [phone] at 1w5dpresenting to MAU with complaints of elevated BP. She was seen in her OB's office this afternoon; in which she presented after seeing floaters in her vision. She was instructed to come directly to MAU however went to the office because she wanted to have a urine test done and wanted her BP checked in the office before coming to the ER.  She has a history of chronic hypertension and has been taking labatelol 200 mg BID,  recently increased to TID.  She has a history of a preterm delivery _0  weeks due to preeclampsia. She has taken 2 doses of labetalol today as prescribed. She denies visual changes at this time.   + fetal movements Denies vaginal bleeding Denies leaking of fluid   OB History    Gravida Para Term Preterm AB TAB SAB Ectopic Multiple Living   _1 Past Medical History  Diagnosis Date  . GERD (gastroesophageal reflux disease)   . Hypertension     Past Surgical History  Procedure Laterality Date  . Hernia repair      Family History  Problem Relation Age of Onset  . Hypertension Mother   . Cancer Father   . Diabetes Father   . Hypertension Father   . Diabetes Brother   . Kidney failure Brother   . Heart attack Paternal Grandmother   . Heart attack Paternal Grandfather     History  Substance Use Topics  . Smoking status: Never Smoker   . Smokeless tobacco: Not on file  . Alcohol Use: No    Allergies: No Known Allergies  Prescriptions prior to admission  Medication Sig Dispense Refill Last Dose  . acetaminophen (TYLENOL) 500 MG tablet Take 500 mg by mouth every 6 (six) hours as needed for moderate pain or headache.   Past Week at Unknown time  . ARGININE PO Take 1 tablet by mouth daily.   01/08/2015 at Unknown time  . calcium  carbonate (TUMS - DOSED IN MG ELEMENTAL CALCIUM) 500 MG chewable tablet Chew 2 tablets by mouth as needed for indigestion or heartburn.   01/09/2015 at Unknown time  . cholecalciferol (VITAMIN D) 1000 UNITS tablet Take 1,000 Units by mouth daily.   01/09/2015 at Unknown time  . labetalol (NORMODYNE) 200 MG tablet Take 1 tablet (200 mg total) by mouth 3 (three) times daily. 30 tablet 2   . loratadine (CLARITIN) 10 MG tablet Take 10 mg by mouth daily as needed for allergies.   01/08/2015 at Unknown time  . losartan-hydrochlorothiazide (HYZAAR) 100-12.5 MG per tablet Take 1 tablet by mouth daily. (Patient not taking: Reported on 01/09/2015) 90 tablet 3   . Prenatal Vit-Fe Fumarate-FA (MULTIVITAMIN-PRENATAL) 27-0.8 MG TABS tablet Take 1 tablet by mouth daily at 12 noon.   01/09/2015 at Unknown time  . Tetrahydrozoline HCl (VISINE OP) Apply 2 drops to eye daily.   01/08/2015 at Unknown time   Results for orders placed or performed during the hospital encounter of 01/29/15 (from the past 48 hour(s))  Protein / creatinine ratio, urine     Status: Abnormal   Collection Time: 01/29/15  4:16 PM  Result Value  Ref Range   Creatinine, Urine 96.00 mg/dL   Total Protein, Urine 556 mg/dL    Comment: NO NORMAL RANGE ESTABLISHED FOR THIS TEST RESULTS CONFIRMED BY MANUAL DILUTION    Protein Creatinine Ratio 5.79 (H) 0.00 - 0.15 mg/mg[Cre]  CBC     Status: Abnormal   Collection Time: 01/29/15  4:38 PM  Result Value Ref Range   WBC 22.4 (H) 4.0 - 10.5 K/uL   RBC 3.82 (L) 3.87 - 5.11 MIL/uL   Hemoglobin 12.7 12.0 - 15.0 g/dL   HCT 36.3 36.0 - 46.0 %   MCV 95.0 78.0 - 100.0 fL   MCH 33.2 26.0 - 34.0 pg   MCHC 35.0 30.0 - 36.0 g/dL   RDW 13.6 11.5 - 15.5 %   Platelets 162 150 - 400 K/uL  Comprehensive metabolic panel     Status: Abnormal   Collection Time: 01/29/15  4:38 PM  Result Value Ref Range   Sodium 132 (L) 135 - 145 mmol/L   Potassium 5.0 3.5 - 5.1 mmol/L   Chloride 105 101 - 111 mmol/L   CO2 21 (L) 22 - 32  mmol/L   Glucose, Bld 85 65 - 99 mg/dL   BUN 15 6 - 20 mg/dL   Creatinine, Ser 1.01 (H) 0.44 - 1.00 mg/dL   Calcium 9.1 8.9 - 10.3 mg/dL   Total Protein 6.5 6.5 - 8.1 g/dL   Albumin 3.1 (L) 3.5 - 5.0 g/dL   AST 48 (H) 15 - 41 U/L   ALT 25 14 - 54 U/L   Alkaline Phosphatase 93 38 - 126 U/L   Total Bilirubin 1.1 0.3 - 1.2 mg/dL   GFR calc non Af Amer >60 >60 mL/min   GFR calc Af Amer >60 >60 mL/min    Comment: (NOTE) The eGFR has been calculated using the CKD EPI equation. This calculation has not been validated in all clinical situations. eGFR's persistently <60 mL/min signify possible Chronic Kidney Disease.    Anion gap 6 5 - 15  Uric acid     Status: None   Collection Time: 01/29/15  4:38 PM  Result Value Ref Range   Uric Acid, Serum 5.4 2.3 - 6.6 mg/dL  Lactate dehydrogenase     Status: Abnormal   Collection Time: 01/29/15  4:38 PM  Result Value Ref Range   LDH 457 (H) 98 - 192 U/L  ABO/Rh     Status: None   Collection Time: 01/29/15  4:38 PM  Result Value Ref Range   ABO/RH(D) O POS   Type and screen for Red Blood Exchange     Status: None (Preliminary result)   Collection Time: 01/29/15  6:30 PM  Result Value Ref Range   ABO/RH(D) O POS    Antibody Screen NEG    Sample Expiration 02/01/2015    Unit Number [id number]    Blood Component Type RBC LR PHER2    Unit division 00    Status of Unit ALLOCATED    Transfusion Status OK TO TRANSFUSE    Crossmatch Result Compatible    Unit Number [id number]    Blood Component Type RED CELLS,LR    Unit division 00    Status of Unit ALLOCATED    Transfusion Status OK TO TRANSFUSE    Crossmatch Result Compatible    Unit Number [id number]    Blood Component Type RBC LR PHER2    Unit division 00    Status of Unit ALLOCATED    Transfusion Status OK TO  TRANSFUSE    Crossmatch Result Compatible    Unit Number [id number]    Blood Component Type RED CELLS,LR    Unit division 00    Status of Unit ALLOCATED     Transfusion Status OK TO TRANSFUSE    Crossmatch Result Compatible   Prepare RBC (crossmatch)     Status: None   Collection Time: 01/29/15  6:36 PM  Result Value Ref Range   Order Confirmation ORDER PROCESSED BY BLOOD BANK      Review of Systems  Constitutional: Negative for fever.  Eyes: Positive for blurred vision (Seeing floaters; not at this time. It comes and goes ).  Cardiovascular: Negative for leg swelling.  Gastrointestinal: Negative for abdominal pain.  Neurological: Negative for headaches.   Physical Exam   Blood pressure 168/106, pulse 96, temperature 97.7 F (36.5 C), temperature source Oral, resp. rate 20, height _0  (1.702 m), weight 117.209 kg (258 lb 6.4 oz), last menstrual period 03/04/2014, SpO2 99 %, unknown if currently breastfeeding.  Physical Exam  Constitutional: She is oriented to person, place, and time. She appears well-developed and well-nourished.  Non-toxic appearance. She does not have a sickly appearance. She does not appear ill. No distress.  HENT:  Head: Normocephalic.  Eyes: Pupils are equal, round, and reactive to light.  Neck: Neck supple.  Cardiovascular: Normal rate and normal heart sounds.   Respiratory: Effort normal.  GI: Soft. There is no tenderness.  Musculoskeletal: She exhibits no edema.  Neurological: She is alert and oriented to person, place, and time. She has normal reflexes. She displays normal reflexes.  Negative clonus   Skin: Skin is warm. She is not diaphoretic.  Psychiatric: Her behavior is normal.   Fetal Tracing: Baseline: 150 bpm  Variability: moderate  Accelerations: 10x10 Decelerations: quick variables  Toco: None  Fetal Tracing: 1710 Baseline: 90's with variables into the 60's   MAU Course  Procedures  None  MDM  IV line established Q15 BP readings ordered  NST  Hyrdralazine 10 mg Labatelol 20 mg IV   PIH labs including protein creatinine ratio ordered   1710: RN called out with concerns about  fetal tracing; Dr. Ulanda Edison called to come to MAU.  Daiva Nakayama CNM with bedside US to confirm fetal heart rate in the 120's.  [place] tech at the bedside; bedside US ordered to evaluate fetal heart rate and the placenta.  Spoke to Dr. Ulanda Edison while he was on his way to MAU. Discussed fetal tracing, labs, and pending protein creatine ratio.  Discussed BP and management including labetalol and hydralazine.  1720: Dr. Ulanda Edison at the bedside; Labetalol protocol initiated patient given 40 mg then 80 mg (patient received 20 mg of labetalol and 10 mg of hydralazine)  Reviewed fetal tracing at the bedside.  1730: Dr. Ulanda Edison notified of protein creatine ratio  1730: Magnesium sulfate ordered, betamethasone 12 mg ordered per Dr. Ulanda Edison  1750: RN called out to inform NP that patient was on the bedpan and passed a large amount of bright red blood with large clots. The patient denied pain. Fetal heart rate confirmed with fetal monitor by NP and CNM in the 80's; maternal HR in the 60's-90's. Dr. Ulanda Edison notified and asked to come to MAU ASAP 1755: Dr. Ulanda Edison at bedside and patient was emergently taken to the OR for STAT cesarean section.      Assessment and Plan   A:  Severe preeclampsia with chronic hypertension  Fetal distress  Vaginal bleeding in 2nd trimester  Patient to OR for stat/emergent cesarean section per Dr. Victoriano Lain I Bianca Vester, NP 01/29/2015 7:27 PM

## 2015-01-29 NOTE — MAU Note (Signed)
Code cesarean called. Report given to Dr. Royce Macadamia as pt leaving unit

## 2015-01-29 NOTE — MAU Note (Signed)
Numerous attempts to trace FHR, cardio adjusted, doppler used to find FHR.  Marga Hoots RN @ bedside helping to adjust EFM.

## 2015-01-29 NOTE — MAU Note (Signed)
Pt presents from the office for evaluation of increased BP. Hx preeclampsia with last pregnancy. Denies HA. Complains of silver sparkly floater. Takes 200mg  labetalol 3x day

## 2015-01-29 NOTE — MAU Note (Signed)
Bedside US in progress

## 2015-01-29 NOTE — MAU Note (Signed)
NP notified of BP. 

## 2015-01-29 NOTE — Op Note (Signed)
NAME:  Bethany [name], Bethany [name] NO.:  [id number]  MEDICAL RECORD NO.:  [id number]  LOCATION:  [facility]                          FACILITY:  [facility]  PHYSICIAN:  Lucille Passy. Ulanda Edison, M.D. DATE OF BIRTH:  [birth year]  DATE OF PROCEDURE:  01/29/2015 DATE OF DISCHARGE:                              OPERATIVE REPORT   PREOPERATIVE DIAGNOSES:  Intrauterine pregnancy, 24 weeks and 5 days, abruption of the placenta, severe preeclampsia, superimposed on chronic hypertension, fetal bradycardia.  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  OPERATION:  Low-transverse cervical C-section.  OPERATOR:  Lucille Passy. Ulanda Edison, M.D.  ASSISTANT:  Dr. Roselie Awkward.  ANESTHESIA:  General anesthesia, Dr. Royce Macadamia.  DESCRIPTION OF PROCEDURE:  The patient had bled about 500 mL  estimated in the maternity admission unit,  fetal heart tones were 87-90, and the patient was immediately transported to the operating room for a C- section.  A very quick prep with Betadine was done.  A Foley catheter was indwelling.  Dr. Royce Macadamia gave a general anesthesia, intubated the patient, had artery prepped the patient with Betadine and draped as a sterile field.  After he said it was okay, I made the incision transversely through the skin and subcutaneous tissue, and fascia.  The fascia was separated from the rectus muscle very quickly superiorly and inferiorly.  The peritoneum was opened in the midline.  The incision was stretched.  A Balfour retractor was placed inferiorly and a short incision was made transversely through the superficial layers of the myometrium in the lower uterine segment.  I entered the amniotic sac with my finger, pulled superiorly and inferiorly.  Blood clots came out of the incision.  I  palpated to see where the infant's vertex was.  It was up in the left upper abdomen.  I pulled the vertex down and easily delivered the baby, clamped the cord, gave the infant to the neonatologist, Dr. Clifton James, who was in attendance.  She assigned the infant Apgars of 1 at 1 minute, 6 at 5 minutes and 7 at 10 minutes.  The cord pH was 6.99.  The placenta was removed intact.  The inside of the uterus was felt and was free of any additional debris.  The cervix was dilated with a ring forceps and then the uterine incision was closed with 2 running sutures of 0 Vicryl locking the first layer, nonlocking on the second layer couple of extra figure-of-eight sutures were required for complete hemostasis.  The uterus appears slightly [place]. Both tubes and ovaries appeared normal.  There was about a 4-cm fibroid on the posterior mid uterus. Hemostasis was completely adequate at this point.  I removed the Balfour retractor, closed the abdominal wall in layers using interrupted sutures of 0 Vicryl on the rectus muscle and peritoneum in 1 layer, two running sutures of 0 Vicryl on the fascia, running 3-0 Vicryl on the subcutaneous tissue, and staples on the skin.  The patient seemed to tolerate the procedure well.  Blood loss was estimated in the operating  room at 2000 mL, which almost all of that was preoperative with blood clots in the uterus.  The blood loss was not measured, but estimated was 500 mL where estimating the patient has lost about 2500 mL of blood.  The patient was returned to recovery in satisfactory condition.     Lucille Passy. Ulanda Edison, M.D.     TFH/MEDQ  D:  01/29/2015  T:  01/29/2015  Job:  [id number]

## 2015-01-29 NOTE — MAU Note (Signed)
Labetalol given IV, SRulon Eisenmenger RN in room to assist with EFM, pt using bedpan.  Pt states she has sensation of "something coming out."  Large amount of blood with clots noted in bedpan, J. Rasch NP came into room, Dr. Ulanda Edison notified.

## 2015-01-29 NOTE — MAU Note (Signed)
Rockwell Alexandria CNM @ bedside to find FHR.

## 2015-01-29 NOTE — Progress Notes (Addendum)
Unable to determine FHR baseline, FHR 60-110.

## 2015-01-29 NOTE — Transfer of Care (Signed)
Immediate Anesthesia Transfer of Care Note  Patient: Bethany [name]  Procedure(s) Performed: Procedure(s): CESAREAN SECTION (N/A)  Patient Location: PACU  Anesthesia Type:General  Level of Consciousness: awake, alert , oriented and patient cooperative  Airway & Oxygen Therapy: Patient Spontanous Breathing and Patient connected to nasal cannula oxygen  Post-op Assessment: Report given to RN, Post -op Vital signs reviewed and stable and Patient moving all extremities X 4  Post vital signs: Reviewed and stable  Last Vitals:  Filed Vitals:   01/29/15 1759  BP:   Pulse: 96  Temp:   Resp:     Complications: No apparent anesthesia complications

## 2015-01-29 NOTE — Progress Notes (Addendum)
Baseline FHR indeterminate, 120 with minimal variability, FHR down to 90 @ times.

## 2015-01-29 NOTE — Anesthesia Procedure Notes (Signed)
Procedure Name: Intubation Date/Time: 01/29/2015 6:08 PM Performed by: Gilmer Mor R Pre-anesthesia Checklist: Patient identified, Emergency Drugs available, Suction available, Patient being monitored and Timeout performed Patient Re-evaluated:Patient Re-evaluated prior to inductionOxygen Delivery Method: Circle system utilized Preoxygenation: Pre-oxygenation with 100% oxygen Intubation Type: IV induction, Rapid sequence and Cricoid Pressure applied Laryngoscope Size: Glidescope and 3 Tube size: 7.0 mm Number of attempts: 1 Airway Equipment and Method: Video-laryngoscopy Placement Confirmation: ETT inserted through vocal cords under direct vision,  positive ETCO2 and breath sounds checked- equal and bilateral Secured at: 21 cm Tube secured with: Tape Dental Injury: Teeth and Oropharynx as per pre-operative assessment

## 2015-01-29 NOTE — MAU Note (Signed)
Dr. Ulanda Edison at bedside.

## 2015-01-29 NOTE — H&P (Signed)
NAME:  Bethany [name], Bethany [name] NO.:  [id number]  MEDICAL RECORD NO.:  [id number]  LOCATION:  3710                          FACILITY:  [facility]  PHYSICIAN:  Lucille Passy. Ulanda Edison, M.D. DATE OF BIRTH:  July 03, 1976  DATE OF ADMISSION:  01/29/2015 DATE OF DISCHARGE:                             HISTORY & PHYSICAL   PRESENT ILLNESS:  This is a 39 year old black female, para 2002 gravida 3 at 24 weeks and 5 days gestation with [facility] of May 16, 2015, admitted in initially with high blood pressure and visual changes, and then developed some abnormality of the fetal heart rate.  Subsequently, she had heavy vaginal bleeding.  The heart rate of the baby dropped to 88-90, and she was taken emergently for emergent C-section.  Her past OB history reveals she began her prenatal course in our office at 9 weeks and 2 days.  She was a new patient to our office.  She had a history of hypertension. She was on losartan prior to pregnancy, changed to methyldopa twice a day.  She felt okay on that.  She had a history of preeclampsia x2, requiring early induction at 37 weeks and about 34 weeks.  She had a history of PCOS, but conceived spontaneously this pregnancy after a failed fertility treatment in the past.  She had no history of gestational diabetes mellitus.  Dr. Marvel Plan discussed with her cystic fibrosis testing in [facility], she decided to proceed.  The patient declined AFP test.  The [facility] suggested low risk.  Ultrasound exam at 18 weeks showed 2 fibroids 3-4 cm at 20 weeks.  She was taking Aldomet 500 three times a day, blood pressure was not controlled.  It was as high as 160/120 and 158/110.  Dr. Marvel Plan placed her on labetalol 200 mg twice a day and discontinued the Aldomet.  At 20 weeks and 2 days, the patient was seen with blood pressures of 125-150/75-100 on the labetalol.  At 21 weeks and 6 days, her blood pressure in our office was 174/112 in 164/112.  She was sent to maternity  admission unit.  The blood pressures responded toward normal, and she called at 23 weeks stating that her blood pressures have run from 145-149/85-90.  At 23 weeks and 1 day, she called and said that her blood pressure was having high blood pressures.  Dr. Melba Coon advised her to increase her labetalol from 200 three times a day to 300 three times a day.  At 24 weeks, the patient reported that her blood pressures at home were running 150/100 and no higher on the labetalol 300 three times a day. Her blood pressure in the office was 152/100.  An ultrasound on January 26, 2015, showed an estimated fetal weight in the 42nd  percentile, AFI of 15, fibroids.  On June 27, the patient called the office saying that she had experienced silvery, shiny, sparking dots in her vision.  Her blood pressure she said was 185/120. Checked it while she was on the phone with our provider and it was 177/119.  She had no headache.  The patient came to the maternity admission unit and there her blood pressure after she had come  to our office and her blood pressure was 212/120.  She went to MAU.  In the MAU, her initial blood pressures were quite high and the fetal heart tones were normal.  However, later the fetal heart tones began to show intermittent bradycardia.  The MAU called me.  I responded immediately and her blood pressure was at a high level, initially at 199/128.  She had been given Apresoline 10 mg and labetalol 20 mg.  I wanted to go up on the labetalol to 40 and  80 and started her on magnesium sulfate and gave her betamethasone.  Ultrasound came in to assess for any problems with the fetus and shortly after the ultrasound was done, I was called stat to the MAU and I responded immediately and the patient had passed what the nurse estimated 500 mL of blood when she got up to go to the restroom.  The fetal heart tones were 88-89.  The MAU had already called the operating room, so I immediately went to  the operating room, asked Dr. Emeterio Reeve to assist me.  I prepped quickly with Betadine.  Dr. Royce Macadamia induced general anesthesia, and I did the delivery which I will dictate separately.  PAST MEDICAL HISTORY:  Significant for high blood pressure.  SURGICAL HISTORY:  She had a hernia repaired at age 85.  ALLERGIES:  She has no known drug allergies.  No latex allergy or food allergies.  PAST OBSTETRIC HISTORY:  The patient claims to have had 2 prior deliveries, both induced for preeclampsia.  SOCIAL HISTORY:  She has never smoked.  Does not drink and does not take drugs.  She is married.  FAMILY HISTORY:  Mother has high blood pressure.  Father with diabetes, cancer of the prostate, and cancer of the stomach.  Brother has diabetes.  PHYSICAL EXAMINATION:  VITAL SIGNS:  On admission, the blood pressure in the MAU was as high as 199/128, pulse was 96, respirations 18.  At the time I saw her for the second time in the MAU, she had passed the blood. The fetal heart tones were 88-90 on the monitor.  She was taken emergently to the operating room.  ADMITTING IMPRESSION:  Intrauterine pregnancy at 24 weeks and 5 days, abruption of the placenta, possible HELLP syndrome, severe preeclampsia superimposed on chronic hypertension,  and fibroids.  The patient is taken emergently for C-section.     Lucille Passy. Ulanda Edison, M.D.     TFH/MEDQ  D:  01/29/2015  T:  01/29/2015  Job:  [id number]

## 2015-01-29 NOTE — Progress Notes (Addendum)
FHR in the 90's, Dr. Ulanda Edison @ bedside, to OR for stat C/S.  Mag sulfate 4 gram bolus started upon leaving MAU.

## 2015-01-29 NOTE — MAU Note (Signed)
Cardiac activity visible on sonosite, EFM readjusted.

## 2015-01-29 NOTE — MAU Note (Signed)
FHR 125 on EFM.

## 2015-01-29 NOTE — Anesthesia Preprocedure Evaluation (Signed)
Anesthesia Evaluation  Patient identified by MRN, date of birth, ID band Patient awake    Reviewed: Allergy & Precautions, NPO status , Patient's Chart, lab work & pertinent test results  Airway Mallampati: III  TM Distance: >3 FB Neck ROM: Full    Dental no notable dental hx. (+) Missing   Pulmonary neg pulmonary ROS,  breath sounds clear to auscultation  Pulmonary exam normal       Cardiovascular hypertension, Pt. on medications and Pt. on home beta blockers Normal cardiovascular examRhythm:Regular Rate:Normal  HTN with superimposed pre eclampsia   Neuro/Psych negative neurological ROS  negative psych ROS   GI/Hepatic Neg liver ROS, GERD-  Medicated and Controlled,  Endo/Other  Morbid obesity  Renal/GU negative Renal ROS  negative genitourinary   Musculoskeletal negative musculoskeletal ROS (+)   Abdominal (+) + obese,   Peds  Hematology   Anesthesia Other Findings   Reproductive/Obstetrics (+) Pregnancy Severe pre eclampsia Fetal bradycardia Placental abruption  24 5/7 weeks                             Anesthesia Physical Anesthesia Plan  ASA: III  Anesthesia Plan: General   Post-op Pain Management:    Induction: Intravenous, Rapid sequence and Cricoid pressure planned  Airway Management Planned: Video Laryngoscope Planned and Oral ETT  Additional Equipment:   Intra-op Plan:   Post-operative Plan: Extubation in OR  Informed Consent: I have reviewed the patients History and Physical, chart, labs and discussed the procedure including the risks, benefits and alternatives for the proposed anesthesia with the patient or authorized representative who has indicated his/her understanding and acceptance.   Dental advisory given  Plan Discussed with: CRNA, Anesthesiologist and Surgeon  Anesthesia Plan Comments:         Anesthesia Quick Evaluation

## 2015-01-29 NOTE — Progress Notes (Signed)
Patient ID: Bethany [name], female   DOB: 10-08-1975, 39 y.o.   MRN: [id number] VS are good and pt is awake and alert. Her creatinine has increased from 1.01 to 1.35 and the RN feels her bleeding is a little more than expected.iNEXPLICABLY, THE PITOCIN WAS D/CED IN pacu. I examined the vagina. There is a small amount of clot but no bright red blood. The cervix is almost closed and the pt tolerates the exam poorly but I do not think the uterus is distended. I will check a magnesium level because I get no DTR at the knee.

## 2015-01-30 LAB — COMPREHENSIVE METABOLIC PANEL WITH GFR
ALT: 23 U/L (ref 14–54)
AST: 46 U/L — ABNORMAL HIGH (ref 15–41)
Albumin: 2.3 g/dL — ABNORMAL LOW (ref 3.5–5.0)
Alkaline Phosphatase: 63 U/L (ref 38–126)
Anion gap: 6 (ref 5–15)
BUN: 28 mg/dL — ABNORMAL HIGH (ref 6–20)
CO2: 22 mmol/L (ref 22–32)
Calcium: 7.8 mg/dL — ABNORMAL LOW (ref 8.9–10.3)
Chloride: 101 mmol/L (ref 101–111)
Creatinine, Ser: 1.94 mg/dL — ABNORMAL HIGH (ref 0.44–1.00)
GFR calc Af Amer: 37 mL/min — ABNORMAL LOW
GFR calc non Af Amer: 32 mL/min — ABNORMAL LOW
Glucose, Bld: 133 mg/dL — ABNORMAL HIGH (ref 65–99)
Potassium: 4.8 mmol/L (ref 3.5–5.1)
Sodium: 129 mmol/L — ABNORMAL LOW (ref 135–145)
Total Bilirubin: 0.4 mg/dL (ref 0.3–1.2)
Total Protein: 4.9 g/dL — ABNORMAL LOW (ref 6.5–8.1)

## 2015-01-30 LAB — CBC
HCT: 25.6 % — ABNORMAL LOW (ref 36.0–46.0)
HEMATOCRIT: 20.2 % — AB (ref 36.0–46.0)
HEMOGLOBIN: 7.2 g/dL — AB (ref 12.0–15.0)
Hemoglobin: 9 g/dL — ABNORMAL LOW (ref 12.0–15.0)
MCH: 33.5 pg (ref 26.0–34.0)
MCH: 34.1 pg — ABNORMAL HIGH (ref 26.0–34.0)
MCHC: 35.2 g/dL (ref 30.0–36.0)
MCHC: 35.6 g/dL (ref 30.0–36.0)
MCV: 95.2 fL (ref 78.0–100.0)
MCV: 95.7 fL (ref 78.0–100.0)
Platelets: 129 K/uL — ABNORMAL LOW (ref 150–400)
Platelets: 123 10*3/uL — ABNORMAL LOW (ref 150–400)
RBC: 2.69 MIL/uL — ABNORMAL LOW (ref 3.87–5.11)
RBC: 2.11 MIL/uL — AB (ref 3.87–5.11)
RDW: 14 % (ref 11.5–15.5)
RDW: 14.1 % (ref 11.5–15.5)
WBC: 25.7 K/uL — ABNORMAL HIGH (ref 4.0–10.5)
WBC: 25.6 10*3/uL — ABNORMAL HIGH (ref 4.0–10.5)

## 2015-01-30 LAB — COMPREHENSIVE METABOLIC PANEL
ALK PHOS: 72 U/L (ref 38–126)
ALT: 24 U/L (ref 14–54)
ANION GAP: 11 (ref 5–15)
AST: 54 U/L — ABNORMAL HIGH (ref 15–41)
Albumin: 2.4 g/dL — ABNORMAL LOW (ref 3.5–5.0)
BUN: 35 mg/dL — AB (ref 6–20)
CHLORIDE: 99 mmol/L — AB (ref 101–111)
CO2: 18 mmol/L — ABNORMAL LOW (ref 22–32)
Calcium: 7.8 mg/dL — ABNORMAL LOW (ref 8.9–10.3)
Creatinine, Ser: 2.24 mg/dL — ABNORMAL HIGH (ref 0.44–1.00)
GFR calc non Af Amer: 27 mL/min — ABNORMAL LOW (ref >60)
GFR, EST AFRICAN AMERICAN: 31 mL/min — AB (ref >60)
GLUCOSE: 145 mg/dL — AB (ref 65–99)
POTASSIUM: 4.1 mmol/L (ref 3.5–5.1)
Sodium: 128 mmol/L — ABNORMAL LOW (ref 135–145)
Total Bilirubin: 0.4 mg/dL (ref 0.3–1.2)
Total Protein: 5 g/dL — ABNORMAL LOW (ref 6.5–8.1)

## 2015-01-30 LAB — MAGNESIUM
Magnesium: 5.6 mg/dL — ABNORMAL HIGH (ref 1.7–2.4)
Magnesium: 6.6 mg/dL (ref 1.7–2.4)

## 2015-01-30 LAB — FIBRINOGEN: Fibrinogen: 165 mg/dL — ABNORMAL LOW (ref 204–475)

## 2015-01-30 MED ORDER — MAGNESIUM SULFATE 50 % IJ SOLN
1.0000 g/h | INTRAVENOUS | Status: DC
  Filled 2015-01-30: qty 80

## 2015-01-30 NOTE — Anesthesia Postprocedure Evaluation (Signed)
  Anesthesia Post-op Note  Patient: Bethany [name]  Procedure(s) Performed: Procedure(s): CESAREAN SECTION (N/A)  Patient Location: PACU and Women[facility] Unit  Anesthesia Type:General  Level of Consciousness: awake, alert  and oriented  Airway and Oxygen Therapy: Patient Spontanous Breathing  Post-op Pain: mild  Post-op Assessment: Patient's Cardiovascular Status Stable, Respiratory Function Stable, No signs of Nausea or vomiting, Adequate PO intake and Pain level controlled              Post-op Vital Signs: Reviewed and stable  Last Vitals:  Filed Vitals:   01/30/15 1000  BP: 97/54  Pulse: 73  Temp:   Resp: 18    Complications: No apparent anesthesia complications

## 2015-01-30 NOTE — Progress Notes (Signed)
Patient ID: Bethany [name], female   DOB: [birth year], 39 y.o.   MRN: [id number] Pt still feeling bad overall.  Bleeding has been scant She sat up to side of bed but got dizzy so has not done much BP low this AM 80-90/40's and now 112/60  HR 91 at rest Weight gain 900pm to 600am was 4lbs, due for another weight tomorrow AM UOP adequate but not impressive 75-80cc/hour Labs repeated this PM and plts stable at 123K Hgb equilibrated down to 7.4, which is consistent with blood loss yesterday Creatinine higher at 2.34 Magnesium level was 8.1 so d/c for one hour and will restart at 1 gram/hour  Pt s/p abruption and relative hypotension, slowly improving.   Renal function not reversing to normal yet, but UOP stable.  Will continue to fluid restrict pt and follow weights F/u labs in AM

## 2015-01-30 NOTE — Progress Notes (Addendum)
Patient up to side of bed with assistance.  Systolic blood pressure drop of 10 points and diastolic drop of 8 points when going from lying to sitting.  Heart rate in 80s.  Patient c/o of dizziness.  Patient requested to sit up for a moment.  Denies that the dizziness is worsening.  Patient returned to lying in bed.  Lab arrived for blood draw.

## 2015-01-30 NOTE — Progress Notes (Signed)
CRITICAL VALUE ALERT  Critical value received:  6.6 magnesium   Date of notification: 01/30/15  Time of notification:  0625  Critical value read back: yes  Nurse who received alert:  s.Carrianne Hyun  MD notified (1st page):  [phone]  Time MD responded:  Dr. Ulanda Edison

## 2015-01-30 NOTE — Addendum Note (Signed)
Addendum  created 01/30/15 1028 by Elenore Paddy, CRNA   Modules edited: Notes Section   Notes Section:  File: [id number]

## 2015-01-30 NOTE — Lactation Note (Addendum)
This note was copied from the chart of Bethany [name]. Lactation Consultation Note  Patient Name: Bethany [name] ESPQZ'R Date: 01/30/2015 Reason for consult: Initial assessment;NICU baby NICU baby 38 hours old. Mom has history of PCOS and htn, and is in AICU on Magnesium. Mom had an abruption and EBL is 1800. Mom states that she nursed first child for 3 years, but second child--who was premature--would never latch. Set mom up with DEBP and started her pumping. Enc mom to pump 8 times a day, every 2-3 hours for 15 minutes. Assisted mom to hand express with no colostrum present at this time. Discussed normal progression of milk coming in and how PCOS and blood loss can impact supply. Mom sleepy at this time, and may need teaching reinforced. Mom has small bottle within reach and knows to pump at least every 3 hours. Mom does not have a support person in room, states husband will be in later today, so enc mom to call out for assistance as needed.   Maternal Data Has patient been taught Hand Expression?: Yes Does the patient have breastfeeding experience prior to this delivery?: Yes  Feeding    LATCH Score/Interventions                      Lactation Tools Discussed/Used Pump Review: Setup, frequency, and cleaning;Milk Storage Initiated by:: JW Date initiated:: 01/31/15   Consult Status Consult Status: Follow-up Date: 01/31/15 Follow-up type: In-patient    Inocente Salles 01/30/2015, 12:51 PM

## 2015-01-30 NOTE — Progress Notes (Signed)
1240 - Patient's heart rate at rest 140-150s and quickly dropped to 107 and then 80s.  Patient denies dizziness, pain, shortness of breath, palpitations.  Dr. Marvel Plan notified.  Updated on VS and output.  CBC, CMP, and Magnesium level ordered for 1500.  Will continue to monitor.

## 2015-01-30 NOTE — Progress Notes (Addendum)
Patient ID: Bethany [name], female   DOB: 07-21-1976, 39 y.o.   MRN: [id number] #1 Afebrile BP slightly low. HGB 9 at 3:30 AM Output is acceptable but creatinine continues to rise so will need to restrict fluids to avoid overload Platelets are stable as are LFT's. Will restrict fluid intake and watch renal function studies. Will continue magnesium sulfate for now. Fibrinogen was 97 last night and is 165 now If renal function continues to deteriorate will get renal consult

## 2015-01-30 NOTE — Progress Notes (Signed)
Dr. Ulanda Edison in to see patient this morning.  Gave order to decrease LR to 20 ml/hr and stop Pitocin at 1100.  Also, gave order for overall fluid restriction intake of 2,500 ml/day for oral and IV intake combined.  Notifed Dr. Ulanda Edison that patient's heart rate increased to 140-150s this morning when patient sat up in bed but decreased to 60s-70s after a few minutes.  Patient asymptomatic per night shift nurse.

## 2015-01-30 NOTE — Progress Notes (Signed)
CRITICAL VALUE ALERT  Critical value received:  Mag 8.1  Date of notification:  01/30/2015  Time of notification: 2591  Critical value read back:Yes.    Nurse who received alert:  Ardelle Lesches, RN House Coverage  MD notified (1st page):  Dr. Marvel Plan  Time of first page:  1733  MD notified (2nd page):  Time of second page:  Responding MD:  Dr. Marvel Plan  Time MD responded:  1733  Dr. Marvel Plan gave order to stop Magnesium for 1 hour and restart in hour at 1 g/hr.

## 2015-01-31 ENCOUNTER — Encounter (HOSPITAL_COMMUNITY): Payer: Self-pay | Admitting: Obstetrics and Gynecology

## 2015-01-31 LAB — COMPREHENSIVE METABOLIC PANEL
ALBUMIN: 2.3 g/dL — AB (ref 3.5–5.0)
ALT: 22 U/L (ref 14–54)
AST: 47 U/L — AB (ref 15–41)
Alkaline Phosphatase: 66 U/L (ref 38–126)
Anion gap: 8 (ref 5–15)
BILIRUBIN TOTAL: 0.1 mg/dL — AB (ref 0.3–1.2)
BUN: 39 mg/dL — ABNORMAL HIGH (ref 6–20)
CHLORIDE: 103 mmol/L (ref 101–111)
CO2: 24 mmol/L (ref 22–32)
Calcium: 7.9 mg/dL — ABNORMAL LOW (ref 8.9–10.3)
Creatinine, Ser: 2.33 mg/dL — ABNORMAL HIGH (ref 0.44–1.00)
GFR calc Af Amer: 30 mL/min — ABNORMAL LOW (ref >60)
GFR calc non Af Amer: 26 mL/min — ABNORMAL LOW (ref >60)
Glucose, Bld: 106 mg/dL — ABNORMAL HIGH (ref 65–99)
Potassium: 4.4 mmol/L (ref 3.5–5.1)
SODIUM: 135 mmol/L (ref 135–145)
Total Protein: 5.2 g/dL — ABNORMAL LOW (ref 6.5–8.1)

## 2015-01-31 LAB — CBC
HCT: 19.2 % — ABNORMAL LOW (ref 36.0–46.0)
Hemoglobin: 6.8 g/dL — CL (ref 12.0–15.0)
MCH: 34 pg (ref 26.0–34.0)
MCHC: 35.4 g/dL (ref 30.0–36.0)
MCV: 96 fL (ref 78.0–100.0)
Platelets: 108 10*3/uL — ABNORMAL LOW (ref 150–400)
RBC: 2 MIL/uL — ABNORMAL LOW (ref 3.87–5.11)
RDW: 14.4 % (ref 11.5–15.5)
WBC: 22.4 10*3/uL — ABNORMAL HIGH (ref 4.0–10.5)

## 2015-01-31 LAB — MAGNESIUM: Magnesium: 6.4 mg/dL (ref 1.7–2.4)

## 2015-01-31 MED ORDER — SODIUM CHLORIDE 0.9 % IV SOLN
INTRAVENOUS | Status: DC
  Administered 2015-01-31 – 2015-02-01 (×3): via INTRAVENOUS

## 2015-01-31 NOTE — Progress Notes (Signed)
UR chart review completed.  

## 2015-01-31 NOTE — Progress Notes (Signed)
Patient ID: Bethany [name], female   DOB: 05-23-1976, 39 y.o.   MRN: [id number] #[id number] afebrile BP normal HGB has dropped appropriately to 6.8 and the creatinine appears to be stabilizing at 2.33 She is eating well and passing flatus. Yesterday afternoon she experienced some weakness when arising but not last night. I will not give her blood if she has no postural hypotension. I will d/c the magnesium sulfate and d/c foley if she has no sx on getting to the restroom. I have spoken to Dr. Erling Cruz, a nephrologist, and he feels no need to see her if she improves. He advises giving her volume expansion with saline at 100 cc's per hour. He states with her excellent urine output we will not overload her.

## 2015-01-31 NOTE — Progress Notes (Signed)
CRITICAL VALUE ALERT  Critical value received:  6.8 Hgb   &    6.4 magnesium  Date of notification:  01/31/15  Time of notification:  0622  Critical value read back:yes  Nurse who received alert: s.Keasha Malkiewicz  Time of first page:  0623  Responding MD:  Marvel Plan  Time MD responded:  [phone]

## 2015-01-31 NOTE — Progress Notes (Signed)
Patient reports heart "fluttering."  Reports that this is not new.  States, "I had this before pregnancy and during pregnancy."  Reports that she did not have the "fluttering" while on the Magnesium.  Dr. Ulanda Edison notified.  Ordered stat EKG.  Also, stated patient may have Motrin for pain.  Aware of platelets.

## 2015-01-31 NOTE — Lactation Note (Signed)
This note was copied from the chart of Bethany [name]. Lactation Consultation Note  Patient Name: Bethany Debie Ashline MOQHU'T Date: 01/31/2015 Reason for consult: Follow-up assessment;NICU baby NICU baby 30 hours old. Mom states that she still isn't getting anything when she pumps. Discussed the normal progression of milk coming in and factors that can delay the progression. Enc mom to continue to stimulate breast with the pump. Assisted mom to start pumping this morning. Enc mom to call for assistance as needed.   Maternal Data    Feeding    LATCH Score/Interventions                      Lactation Tools Discussed/Used     Consult Status Consult Status: Follow-up Date: 02/01/15 Follow-up type: In-patient    Inocente Salles 01/31/2015, 8:47 AM

## 2015-01-31 NOTE — Progress Notes (Signed)
1730 - Patient ambulated around unit.  Tolerated well.  Denies dizziness, pain, shortness of breath.  Patient sitting up in chair.  Call bell within reach.

## 2015-01-31 NOTE — Progress Notes (Signed)
1630 -  Patient's foley catheter removed.

## 2015-01-31 NOTE — Progress Notes (Signed)
Patient up to chair.  Denies dizziness, shortness of breath, pain.  Call bell within reach.  Husband and daughters at bedside.

## 2015-02-01 DIAGNOSIS — O1410 Severe pre-eclampsia, unspecified trimester: Secondary | ICD-10-CM

## 2015-02-01 DIAGNOSIS — I10 Essential (primary) hypertension: Secondary | ICD-10-CM

## 2015-02-01 DIAGNOSIS — O4590 Premature separation of placenta, unspecified, unspecified trimester: Secondary | ICD-10-CM

## 2015-02-01 DIAGNOSIS — N179 Acute kidney failure, unspecified: Secondary | ICD-10-CM

## 2015-02-01 DIAGNOSIS — D62 Acute posthemorrhagic anemia: Secondary | ICD-10-CM

## 2015-02-01 DIAGNOSIS — N939 Abnormal uterine and vaginal bleeding, unspecified: Secondary | ICD-10-CM

## 2015-02-01 DIAGNOSIS — O10919 Unspecified pre-existing hypertension complicating pregnancy, unspecified trimester: Secondary | ICD-10-CM

## 2015-02-01 HISTORY — DX: Unspecified pre-existing hypertension complicating pregnancy, unspecified trimester: O10.919

## 2015-02-01 HISTORY — DX: Abnormal uterine and vaginal bleeding, unspecified: N93.9

## 2015-02-01 LAB — CBC
HCT: 19.1 % — ABNORMAL LOW (ref 36.0–46.0)
HEMOGLOBIN: 6.4 g/dL — AB (ref 12.0–15.0)
MCH: 33.9 pg (ref 26.0–34.0)
MCHC: 33.5 g/dL (ref 30.0–36.0)
MCV: 101.1 fL — ABNORMAL HIGH (ref 78.0–100.0)
Platelets: 129 10*3/uL — ABNORMAL LOW (ref 150–400)
RBC: 1.89 MIL/uL — ABNORMAL LOW (ref 3.87–5.11)
RDW: 14.3 % (ref 11.5–15.5)
WBC: 18 10*3/uL — ABNORMAL HIGH (ref 4.0–10.5)

## 2015-02-01 LAB — COMPREHENSIVE METABOLIC PANEL
ALT: 29 U/L (ref 14–54)
ANION GAP: 4 — AB (ref 5–15)
AST: 70 U/L — ABNORMAL HIGH (ref 15–41)
Albumin: 2.6 g/dL — ABNORMAL LOW (ref 3.5–5.0)
Alkaline Phosphatase: 78 U/L (ref 38–126)
BUN: 34 mg/dL — ABNORMAL HIGH (ref 6–20)
CALCIUM: 8.2 mg/dL — AB (ref 8.9–10.3)
CO2: 23 mmol/L (ref 22–32)
Chloride: 112 mmol/L — ABNORMAL HIGH (ref 101–111)
Creatinine, Ser: 1.92 mg/dL — ABNORMAL HIGH (ref 0.44–1.00)
GFR calc Af Amer: 37 mL/min — ABNORMAL LOW (ref >60)
GFR calc non Af Amer: 32 mL/min — ABNORMAL LOW (ref >60)
GLUCOSE: 80 mg/dL (ref 65–99)
Potassium: 4 mmol/L (ref 3.5–5.1)
SODIUM: 139 mmol/L (ref 135–145)
TOTAL PROTEIN: 5.6 g/dL — AB (ref 6.5–8.1)
Total Bilirubin: 0.4 mg/dL (ref 0.3–1.2)

## 2015-02-01 LAB — MAGNESIUM: MAGNESIUM: 8.1 mg/dL — AB (ref 1.7–2.4)

## 2015-02-01 NOTE — Clinical Social Work Maternal (Signed)
CLINICAL SOCIAL WORK MATERNAL/CHILD NOTE  Patient Details  Name: Bethany [name] MRN: [id number] Date of Birth: 08/27/1975  Date:  02/01/2015  Clinical Social Worker Initiating Note:  Meagen Limones E. Brittanya Winburn, LCSW Date/ Time Initiated:  02/01/15/1400     Child's Name:  Bethany [name]   Legal Guardian:   (Parents: Bethany [name] and Bethany [name])   Need for Interpreter:  None   Date of Referral:        Reason for Referral:   (No referral-NICU admission)   Referral Source:      Address:  [address], [place], [place] [address]  Phone number:  [phone]   Household Members:  Spouse, Minor Children (Couple has two other children: Bethany [name]/12 and Bethany [name]/7)   Natural Supports (not living in the home):      Professional Supports:     Employment:     Type of Work:     Education:      Financial Resources:  Private Insurance   Other Resources:      Cultural/Religious Considerations Which May Impact Care: None stated  Strengths:  Compliance with medical plan , Understanding of illness, Pediatrician chosen , Ability to meet basic needs  (Pediatric follow up will be at [facility])   Risk Factors/Current Problems:      Cognitive State:  Alert , Linear Thinking    Mood/Affect:  Bright , Relaxed , Comfortable    CSW Assessment: CSW met with MOB in her AICU room (CSW learned that MOB would be discharged today from AICU) to introduce myself, complete assessment due to admission to NICU at 24.5 weeks, and to offer support.  CSW notes that assessment felt very disjointed as her other children were in and out of the room and a distraction to FOB, who came in part way through talking with MOB.  MOB was pleasant and welcoming of CSW's visit and reports feeling well.  She states she is happy about being able to go home today.  She appears to be in very good spirits.  MOB shared her birth story with CSW and acknowledges that she "need to go home and process."  FOB describes the experience as  "rough," but states baby is doing great.  He states getting a great report every time he goes to NICU to see baby.  CSW encouraged them to celebrate every good day, which will help them cope with the harder days.  MOB states she knows there will be "bad" days.  CSW discussed the emotionality of the NICU journey and encouraged parents to allow themselves to process their emotions and ask CSW for assistance in doing so at any time throughout this experience.  CSW made parents of aware of the ability to ask for a Family Conference at any time during baby's hospitalization and asked that they not be alarmed if NICU staff contact them to arrange a meeting.  Parents appeared very appreciative of this.  CSW encouraged parents to keep a notebook of questions, answers and things they need to get done in order to clear their minds as much as possible.  CSW explained ongoing support services and provided contact information.  CSW was not able to discuss emotional factors at length, including PPD education at this time due to the activity in the room.  CSW will meet with MOB at bedside to discuss in the near future and will monitor for signs and symptoms as weeks go by.  CSW has no social concerns at this time.  CSW Plan/Description:    Patient/Family Education , Psychosocial Support and Ongoing Assessment of Needs    Bethany Hoshino Elizabeth, LCSW 02/01/2015, 3:49 PM 

## 2015-02-01 NOTE — Lactation Note (Signed)
This note was copied from the chart of Bethany Susanne Baumgarner. Lactation Consultation Note  Patient Name: Bethany [name] [id number]'[id number] Date: 02/01/2015 Reason for consult: Follow-up assessment;NICU baby Baby 7 hours old. Mom had been icing her breast. Assisted to hand express using heat and hand pump and able to get EBM to flow. After started flowing, got mom using DEBP and milk continued to flow. Mom has filled out paperwork for 2-week DEBP rental and FOB will pay and get pump later today. Enc mom to keep using ice/frozen vegetables, and pumping as long as breasts engorged. Enc use of heat, massage, and hand pump/hand expression to get milk flowing, followed by DEBP. Discussed with mom the importance of keeping milk flowing. Also enc mom to continue using Ibuprofin OTC at home as well as an anti-inflammatory. Mom aware of [place] phone line assistance after D/C.   Maternal Data    Feeding    LATCH Score/Interventions                      Lactation Tools Discussed/Used     Consult Status Consult Status: PRN Date: 02/02/15 Follow-up type: In-patient    Inocente Salles 02/01/2015, 12:24 PM

## 2015-02-01 NOTE — Progress Notes (Signed)
Pt was sitting up in bed when I arrived. She said she was doing much better compared to a couple days ago. She said her baby is also doing well in NICU. Pt was very bubbly and pleasant during our brief visit to allow her to return to taking care of personal needs regarding baby. Please page if any additional support is needed. [name]   02/01/15 1100  Clinical Encounter Type  Visited With Patient

## 2015-02-01 NOTE — Progress Notes (Signed)
Patient ID: Bethany [name], female   DOB: 27-Jan-1976, 39 y.o.   MRN: [id number] #3 afebrile BP 145/90 HGB stable LFT's relatively stable. Creatinine improved. She is ambulating without sx of postural hypotension, passing flatus, she has had 2 BM's, tolerating a diet and feels ready for D/C. I have spoken to Dr. Florene Glen and he is OK with d/c and repeat labs 1 week. The baby is stable

## 2015-02-01 NOTE — Discharge Instructions (Signed)
booklet °

## 2015-02-01 NOTE — Progress Notes (Signed)
Pt had two urine occurences that went unmeasured as she had a BM both times.

## 2015-02-01 NOTE — Progress Notes (Signed)
Phoned Dr. Ulanda Edison regarding patient's blood pressures over the morning.  He stated that pt should begin taking 100 mg of labetalol twice per day, starting today.  He said that she already has labetalol at home; but if her home tablets are in a larger dosage and cannot be split easily, she can call his office and he will phone in a new prescription to her pharmacy.   He stated that he was unable to log into [facility] to change her AVS and for RN to instruct patient regarding above.

## 2015-02-01 NOTE — Discharge Summary (Signed)
NAME:  Bethany [name], Bethany [name] NO.:  [id number]  MEDICAL RECORD NO.:  [id number]  LOCATION:  2409                          FACILITY:  [facility]  PHYSICIAN:  Lucille Passy. Ulanda Edison, M.D. DATE OF BIRTH:  April 14, 1976  DATE OF ADMISSION:  01/29/2015 DATE OF DISCHARGE:  02/01/2015                              DISCHARGE SUMMARY   HISTORY OF PRESENT ILLNESS:  This is a 39 year old black female, para 2- 0-0-2, gravida 3 at 24 weeks and 5 days gestation with [facility] of May 16, 2015, admitted with high blood pressure, visual changes, subsequently developed some abnormality of the fetal heart rate and when she got up to go to the restroom had heavy vaginal bleeding, baby's heart rate dropped into the 88-90 range.  She was taken emergently for a C-section and underwent a low-transverse cervical C-section by Dr. Ulanda Edison with Dr. Emeterio Reeve assisting.  She was noted to have fibroids on her uterus.  Her blood pressures prior to delivery had been extremely high, highest recorded was 212/120.  In the Antenatal Unit, she was given labetalol and Apresoline, and was taken emergently for a C- section.  The baby was delivered with Apgars of 1, 6, and 7 at one, five, and ten minutes, and has done reasonably well.  The laboratory data showed that the patient had a comprehensive metabolic profile in late May, which showed a normal creatinine of about 0.6.  On admission to the hospital, the patient had a creatinine of 1.02, it is not actually recorded in the notes I am looking at, but then rapidly went up to 1.94, 2.24, and 2.33; however, on the day of discharge, it had decreased to 1.92.  Her initial hemoglobin was 12.7, hematocrit 36.3, white count 22,400.  The initial creatinine was 1.01, BUN was 15.  AST was 48, ALT 25.  LDH was 457.  Platelet count was 162,000.  In the recovery room, her hemoglobin was 10.4, hematocrit 29.3, platelet count 133.  AST and ALT were 42 and 23 respectively.  Early in the  morning of the first postop day, her hemoglobin was 9.0, hematocrit 25.6, platelet count 129,000.  BUN was 28, creatinine 1.94.  AST was 46 and ALT 23.  On the second postop day, her AST and ALT were 54 and 24 respectively, platelet count was 123,000, creatinine was 2.24.  On January 31, 2015, the creatinine was 2.33.  AST and ALT were 47 and 22 respectively.  Platelet count was 108,000.  On the day of discharge, her creatinine was 1.92, AST was 70, ALT 29, platelet count 129,000.  Her hemoglobin had been 7.2 on January 30, 2015, 6.8 on January 31, 2015, and 6.4 on February 01, 2015.  Her hematocrit on these last 3 determinations were 20.2, 19.2, and 19.1. The patient's output has improved significantly.  Her total intake for the last 24 hours was 3948, output was 3150.  I have spoken to Dr. Erling Cruz, nephrologist on the 29th and the 30th, and he advised giving her saline, which we did, and I have advised him of the final creatinine value, and he feels like she is a candidate for discharge and to repeat her labs  in 1 week.  FINAL DIAGNOSES:  Intrauterine pregnancy at 24 weeks and 5 days with abruption of the placenta with resulting significant hemorrhage, severe anemia, acute renal failure, chronic hypertension, severe preeclampsia, leiomyomata uteri.  OPERATION:  Low-transverse cervical C-section.  FINAL CONDITION:  Improved.  INSTRUCTIONS:  Include our regular discharge instruction booklet.  The patient is advised not to take any antihypertensives unless her blood pressure goes above 160/105.  She is tolerating ambulation without difficulty.  She is voiding well, eating a regular diet, and passing flatus, and has had 2 bowel movements.  She is advised that if she develops a severe headache to be sure to call.  If her blood pressure is above 160/105, we asked her to call.  She is to take ferrous sulfate 325 mg twice daily.  She has her own Motrin she can take every 6 hours as needed for  pain, and return to the office in 1 week to let me evaluate her incision, her postoperative recovery, and do labs.     Lucille Passy. Ulanda Edison, M.D.     TFH/MEDQ  D:  02/01/2015  T:  02/01/2015  Job:  [id number]

## 2015-02-01 NOTE — Progress Notes (Signed)
CSW attempted to meet with MOB to introduce myself, complete assessment due to NICU admission and offer support, but she was working with lactation at this time.  CSW will attempt again later today.

## 2015-02-01 NOTE — Lactation Note (Signed)
This note was copied from the chart of Bethany Shauntelle Jamerson. Lactation Consultation Note  Patient Name: Bethany [name] [id number] Date: 02/01/2015 Reason for consult: Follow-up assessment;NICU baby  NICU baby 71 hours old. Mom is becoming engorged. Mom has been pumping this morning and transitional milk is still flowing. However, there are areas of tightness where milk seems to be building up. Assisted mom to ice her breasts and mom able to return-demonstrate hand express with a small amount of flow. Assisted mom to massage breast and use hand pump to reduce tighter areas. Refitted mom with #27 flanges. Used #27 flange on hand pump and mom reported increased comfort and increased flow noted. Plan is for mom to ice breasts, 10-15 minutes on and 10-15 minutes off. Then massage breasts, hand express, and use DEBP every 2 hours, followed by massage and hand expression. Enc mom to use hand pump and massage in between pumping to work on tighter areas. Mom states that ice and larger flanges are increasing her comfort. Enc mom to call for assistance as needed and discussed assessment and interventions with patient's RN Bethany [name]. Maternal Data    Feeding    LATCH Score/Interventions                      Lactation Tools Discussed/Used     Consult Status Consult Status: Follow-up Date: 02/02/15 Follow-up type: In-patient    Bethany [name] 02/01/2015, 9:57 AM

## 2015-02-02 ENCOUNTER — Ambulatory Visit: Payer: Self-pay

## 2015-02-02 LAB — TYPE AND SCREEN
ABO/RH(D): O POS
ANTIBODY SCREEN: NEGATIVE
UNIT DIVISION: 0
UNIT DIVISION: 0
Unit division: 0
Unit division: 0

## 2015-02-02 NOTE — Lactation Note (Signed)
This note was copied from the chart of Bethany Mela Perham. Lactation Consultation Note  Follow up visit made with mom in NICU.  She states her engorgement has improved and milk is flowing well.  Reminded to use massage and hands on pumping.  Encouraged to call for assist/concerns prn.  Patient Name: Bethany [name] OEHOZ'Y Date: 02/02/2015     Maternal Data    Feeding    LATCH Score/Interventions                      Lactation Tools Discussed/Used     Consult Status      Ave Filter 02/02/2015, 5:12 PM

## 2015-02-17 ENCOUNTER — Encounter (HOSPITAL_COMMUNITY)
Admission: RE | Admit: 2015-02-17 | Discharge: 2015-02-17 | Disposition: A | Discharge status: Home / Self Care | Payer: BLUE CROSS/BLUE SHIELD | Source: Ambulatory Visit | Attending: Obstetrics and Gynecology | Admitting: Obstetrics and Gynecology

## 2015-04-15 ENCOUNTER — Ambulatory Visit: Payer: Self-pay

## 2015-04-15 NOTE — Lactation Note (Signed)
This note was copied from the chart of Bethany [name]. Lactation Consultation Note  Follow up with mom in NICU per mom and RN request. Randel Books is 53 days old and id getting closer to d/c. Mom reports that she is having milk supply issues. She has taken Fenugreek with good results in the past but reports that she is currently out and has been unable to go get more. She also daid she has not been pumping like she is supposed to as she has returned to work and has 2 other children at home. NICU is almost out of breast milk for baby. Praised mom for providing so much for baby. Enc her to restart Fenugreek and to increase pumping. She was aware for super pumping and said "I qu;ess I just need to pump more. Enc. Given and told to call LC prn.  Patient Name: Bethany Niajah Sipos CBSWH'Q Date: 04/15/2015     Maternal Data    Feeding Feeding Type: Breast Milk with Formula added Nipple Type: Slow - flow Length of feed: 60 min  LATCH Score/Interventions                      Lactation Tools Discussed/Used     Consult Status      Debby Freiberg Zavien Clubb 04/15/2015, 2:21 PM

## 2015-05-16 ENCOUNTER — Encounter (INDEPENDENT_AMBULATORY_CARE_PROVIDER_SITE_OTHER): Payer: Self-pay

## 2015-07-26 ENCOUNTER — Telehealth: Payer: Self-pay

## 2015-07-26 NOTE — Telephone Encounter (Signed)
Patient has an appointment for a cpe on 08/15/15 and would like to know if we can send enough albetalol hcl 100 MG to last until her appointment.  Pharmacy is [facility] on [address] [place]

## 2015-07-27 ENCOUNTER — Other Ambulatory Visit: Payer: Self-pay | Admitting: Family Medicine

## 2015-07-27 MED ORDER — LABETALOL HCL 100 MG PO TABS: 100.0000 mg | ORAL_TABLET | Freq: Two times a day (BID) | ORAL | Status: DC

## 2015-07-27 NOTE — Telephone Encounter (Signed)
Yes, you may call in enough labetalol to last until CPE

## 2015-07-27 NOTE — Telephone Encounter (Signed)
She states she in Labetatol 100mg  bid. Dr L. Can we refill?

## 2015-07-27 NOTE — Telephone Encounter (Signed)
Rx sent in. Pt notified. 

## 2015-08-15 ENCOUNTER — Encounter: Payer: Self-pay | Admitting: Family Medicine

## 2015-08-16 ENCOUNTER — Other Ambulatory Visit: Payer: Self-pay | Admitting: Family Medicine

## 2015-08-17 NOTE — Telephone Encounter (Signed)
Spoke w/pt who reported she had to cancel her appt this week d/t the snow and her children being out of school. Re-sheduled appt for 1/23 w/ Debbie. RFd med until then.

## 2015-08-27 ENCOUNTER — Ambulatory Visit (INDEPENDENT_AMBULATORY_CARE_PROVIDER_SITE_OTHER): Payer: BLUE CROSS/BLUE SHIELD | Admitting: Physician Assistant

## 2015-08-27 ENCOUNTER — Ambulatory Visit: Payer: Self-pay | Admitting: Family Medicine

## 2015-08-27 VITALS — BP 154/103 | HR 70 | Temp 99.0°F | Resp 16 | Ht 67.5 in | Wt 238.0 lb

## 2015-08-27 DIAGNOSIS — Z0000 Encounter for general adult medical examination without abnormal findings: Secondary | ICD-10-CM | POA: Diagnosis not present

## 2015-08-27 DIAGNOSIS — I10 Essential (primary) hypertension: Secondary | ICD-10-CM

## 2015-08-27 DIAGNOSIS — Z13220 Encounter for screening for lipoid disorders: Secondary | ICD-10-CM

## 2015-08-27 DIAGNOSIS — E559 Vitamin D deficiency, unspecified: Secondary | ICD-10-CM

## 2015-08-27 LAB — COMPREHENSIVE METABOLIC PANEL
ALT: 21 U/L (ref 6–29)
AST: 18 U/L (ref 10–30)
Albumin: 4.5 g/dL (ref 3.6–5.1)
Alkaline Phosphatase: 82 U/L (ref 33–115)
BUN: 14 mg/dL (ref 7–25)
CALCIUM: 9.2 mg/dL (ref 8.6–10.2)
CO2: 24 mmol/L (ref 20–31)
Chloride: 103 mmol/L (ref 98–110)
Creat: 0.69 mg/dL (ref 0.50–1.10)
GLUCOSE: 95 mg/dL (ref 65–99)
POTASSIUM: 4.3 mmol/L (ref 3.5–5.3)
Sodium: 137 mmol/L (ref 135–146)
Total Bilirubin: 0.4 mg/dL (ref 0.2–1.2)
Total Protein: 7.3 g/dL (ref 6.1–8.1)

## 2015-08-27 LAB — LIPID PANEL
CHOL/HDL RATIO: 2.6 ratio (ref <=5.0)
Cholesterol: 145 mg/dL (ref 125–200)
HDL: 56 mg/dL (ref >=46)
LDL CALC: 80 mg/dL (ref <130)
Triglycerides: 45 mg/dL (ref <150)
VLDL: 9 mg/dL (ref <30)

## 2015-08-27 LAB — CBC
HCT: 35.4 % — ABNORMAL LOW (ref 36.0–46.0)
Hemoglobin: 11.5 g/dL — ABNORMAL LOW (ref 12.0–15.0)
MCH: 29.2 pg (ref 26.0–34.0)
MCHC: 32.5 g/dL (ref 30.0–36.0)
MCV: 89.8 fL (ref 78.0–100.0)
MPV: 10.1 fL (ref 8.6–12.4)
PLATELETS: 378 10*3/uL (ref 150–400)
RBC: 3.94 MIL/uL (ref 3.87–5.11)
RDW: 13.7 % (ref 11.5–15.5)
WBC: 7.3 10*3/uL (ref 4.0–10.5)

## 2015-08-27 MED ORDER — LOSARTAN POTASSIUM-HCTZ 100-12.5 MG PO TABS: 1.0000 | ORAL_TABLET | Freq: Every day | ORAL | Status: DC

## 2015-08-27 NOTE — Progress Notes (Signed)
[facility] [address] [place] [address] [phone]  Date:  08/27/2015   Name:  Bethany [name]   DOB:  10/22/1975   MRN:  [id number]  PCP:  No PCP Per Patient    Chief Complaint: Annual Exam   History of Present Illness:  This is a 40 y.o. female with PMH HTN, PCOS, palps who is presenting for CPE.  She takes labetalol for HTN. She recently had child 01/2015. Before becoming pregnant she was on hyzaar which worked a little better. She feels labetalol has not been working well. She is no longer breastfeeding.   Pt had complicated pregnancy - she had emergency c-section d/t pre-eclampsia and placental abruption. Her and baby are doing well now.  She states she is getting back to eating healthy and exercising regularly. She has lost 20 pounds since giving birth. She is feeling well.  She has had problems with palps in the past. States EKGs having always been normal. Hasn't had problems with palps in several months. No cp, sob, dizziness.  Pt has had problems with vit D deficiency in the past. Is not currently taking supplements.  Last had lipid panel 2 years ago and normal.  LMP: 07/29/15 Contraception: paragard IUD. Not having any cramping. Some spotting. Last pap: 02/2015. Has upcoming GYN appt in a few months. Sexual history: sexually active with husband. Does not want STD testing today Immunizations: declines flu today Dentist: has wisdom tooth with cavity, scheduling to get it removed. Eye: no change in vision. Does not wear glasses or contacts. Diet/Exercise: started back on diet. Trying to get 4 days a week exercise. She is exercising at home. Fam hx: father with HTN, DM, cancer. mother with HTN, brother with HTN and DM Tobacco/alcohol/substance use: no/occ (1 per month)/no  Mammogram: never Colonoscopy: never Dexa: never  SurgHx: C-section and hernia repair  Works in [employer]. Has 3 children.   Review of Systems:  Review of Systems   Constitutional: Negative.   HENT: Positive for dental problem. Negative for congestion, ear pain, sore throat and voice change.   Eyes: Negative.   Respiratory: Negative.   Cardiovascular: Negative.   Gastrointestinal: Negative.   Endocrine: Negative.   Genitourinary: Negative.   Musculoskeletal: Negative.   Skin: Negative.   Allergic/Immunologic: Negative.   Neurological: Negative.   Hematological: Negative.   Psychiatric/Behavioral: Negative.     Patient Active Problem List   Diagnosis Date Noted  . Placental abruption 02/01/2015  . Severe preeclampsia 02/01/2015  . H/O cesarean section 01/29/2015  . Uterine fibroid   . Hypertension 06/23/2013  . PCOS (polycystic ovarian syndrome) 06/23/2013  . Palpitations 06/23/2013    Prior to Admission medications   Medication Sig Start Date End Date Taking? Authorizing Provider  labetalol (NORMODYNE) 100 MG tablet TAKE 1 TABLET TWICE A DAY 08/17/15  Yes Robyn Haber, MD  ARGININE PO Take 1 tablet by mouth every other day. Reported on 08/27/2015    Historical Provider, MD  calcium carbonate (TUMS - DOSED IN MG ELEMENTAL CALCIUM) 500 MG chewable tablet Chew 2 tablets by mouth daily. Reported on 08/27/2015    Historical Provider, MD  cholecalciferol (VITAMIN D) 1000 UNITS tablet Take 1,000 Units by mouth daily. Reported on 08/27/2015    Historical Provider, MD  Magnesium 500 MG TABS Take 1 tablet by mouth every other day. Reported on 08/27/2015    Historical Provider, MD  Prenatal Vit-Fe Fumarate-FA (MULTIVITAMIN-PRENATAL) 27-0.8 MG TABS tablet Take 1 tablet by  mouth daily at 12 noon. Reported on 08/27/2015    Historical Provider, MD  Tetrahydrozoline HCl (VISINE OP) Apply 2 drops to eye daily as needed (allergies). Reported on 08/27/2015    Historical Provider, MD    No Known Allergies  Past Surgical History  Procedure Laterality Date  . Hernia repair    . Cesarean section N/A 01/29/2015    Procedure: CESAREAN SECTION;  Surgeon: Newton Pigg, MD;  Location: [facility];  Service: Obstetrics;  Laterality: N/A;    Social History  Substance Use Topics  . Smoking status: Never Smoker   . Smokeless tobacco: None  . Alcohol Use: No    Family History  Problem Relation Age of Onset  . Hypertension Mother   . Cancer Father   . Diabetes Father   . Hypertension Father   . Diabetes Brother   . Kidney failure Brother   . Heart attack Paternal Grandmother   . Heart attack Paternal Grandfather     Medication list has been reviewed and updated.  Physical Examination:  Physical Exam  Constitutional: She is oriented to person, place, and time.  HENT:  Head: Normocephalic and atraumatic.  Right Ear: Hearing, tympanic membrane, external ear and ear canal normal.  Left Ear: Hearing, tympanic membrane, external ear and ear canal normal.  Nose: Nose normal.  Mouth/Throat: Uvula is midline, oropharynx is clear and moist and mucous membranes are normal.  Eyes: Conjunctivae and lids are normal. Right eye exhibits no discharge. Left eye exhibits no discharge. No scleral icterus.  Neck: Trachea normal. Carotid bruit is not present. No thyromegaly present.  Cardiovascular: Normal rate, regular rhythm, normal heart sounds, intact distal pulses and normal pulses.   No murmur heard. Pulmonary/Chest: Effort normal and breath sounds normal. She has no wheezes. She has no rhonchi. She has no rales.  Breast exam deferred, gets at OB/GYN  Abdominal: Soft. Normal appearance and bowel sounds are normal. She exhibits no abdominal bruit. There is no tenderness.  Musculoskeletal: Normal range of motion.  Lymphadenopathy:       Head (right side): No submental, no submandibular and no tonsillar adenopathy present.       Head (left side): No submental, no submandibular and no tonsillar adenopathy present.    She has no cervical adenopathy.  Neurological: She is alert and oriented to person, place, and time. No cranial nerve deficit. Coordination and  gait normal.  Skin: Skin is warm, dry and intact. No lesion and no rash noted.  Hirsutism present, chin  Psychiatric: She has a normal mood and affect. Her speech is normal and behavior is normal. Thought content normal.   BP 154/103 mmHg  Pulse 70  Temp(Src) 99 F (37.2 C) (Oral)  Resp 16  Ht 5' 7.5" (1.715 m)  Wt 238 lb (107.956 kg)  BMI 36.70 kg/m2  SpO2 99%  LMP 07/29/2015  Assessment and Plan:  1. Annual physical exam Up to date on preventative medicine. Up to date pap smear. Gets CBE at GYN. Declines flu vaccine. See labs pending below. We discussed diet, exercise, weight loss. - CBC  2. Essential hypertension D/C labetalol since poorly controlled HTN. Start back on hyzaar. Pt does not intent to get pregnant again and is no longer breastfeeding. Return in 6 months for follow up or sooner if BP not controlled - she has monitor at home. - Comprehensive metabolic panel - losartan-hydrochlorothiazide (HYZAAR) 100-12.5 MG tablet; Take 1 tablet by mouth daily.  Dispense: 90 tablet; Refill: 3  3.  Vitamin D deficiency - Vitamin D 1,25 dihydroxy  4. Lipid screening - Lipid panel   Benjaman Pott. Drenda Freeze, MHS [facility]  08/27/2015

## 2015-08-27 NOTE — Patient Instructions (Signed)
Start back on diet. Exercise 4 days a week or get at least 150 minutes of cardio per week. Stop the labetalol. Start back on hyzaar daily. Take bp at home. If consistently >140/90 in 1 month, return for follow up. I will call you with your lab results. Return in 6 months for follow up.

## 2015-08-30 LAB — VITAMIN D 1,25 DIHYDROXY
VITAMIN D 1, 25 (OH) TOTAL: 56 pg/mL (ref 18–72)
Vitamin D2 1, 25 (OH)2: <8 pg/mL
Vitamin D3 1, 25 (OH)2: 56 pg/mL

## 2015-10-12 ENCOUNTER — Other Ambulatory Visit: Payer: Self-pay | Admitting: Family Medicine

## 2015-10-17 ENCOUNTER — Telehealth: Payer: Self-pay

## 2015-10-23 ENCOUNTER — Encounter: Payer: Self-pay | Admitting: Pediatrics

## 2015-11-21 ENCOUNTER — Encounter: Payer: Self-pay | Admitting: Family Medicine

## 2015-11-21 ENCOUNTER — Ambulatory Visit (INDEPENDENT_AMBULATORY_CARE_PROVIDER_SITE_OTHER): Payer: BLUE CROSS/BLUE SHIELD | Admitting: Family Medicine

## 2015-11-21 VITALS — BP 140/100 | HR 82 | Temp 98.4°F | Resp 16 | Ht 66.5 in | Wt 233.4 lb

## 2015-11-21 DIAGNOSIS — G4482 Headache associated with sexual activity: Secondary | ICD-10-CM

## 2015-11-21 DIAGNOSIS — I10 Essential (primary) hypertension: Secondary | ICD-10-CM | POA: Diagnosis not present

## 2015-11-21 LAB — BASIC METABOLIC PANEL
BUN: 18 mg/dL (ref 7–25)
CALCIUM: 9.4 mg/dL (ref 8.6–10.2)
CO2: 26 mmol/L (ref 20–31)
CREATININE: 0.83 mg/dL (ref 0.50–1.10)
Chloride: 101 mmol/L (ref 98–110)
GLUCOSE: 95 mg/dL (ref 65–99)
Potassium: 3.9 mmol/L (ref 3.5–5.3)
Sodium: 138 mmol/L (ref 135–146)

## 2015-11-21 MED ORDER — LOSARTAN POTASSIUM-HCTZ 100-25 MG PO TABS: 1.0000 | ORAL_TABLET | Freq: Every day | ORAL | Status: DC

## 2015-11-21 NOTE — Progress Notes (Signed)
By signing my name below, I, Bethany [name], attest that this documentation has been prepared under the direction and in the presence of Bethany Parish, MD.  Electronically Signed: Verlee [name], Medical Scribe. 11/21/2015. 2:53 PM.  Subjective:    Patient ID: Bethany [name], female    DOB: 05/08/1976, 40 y.o.   MRN: [id number]  HPI Chief Complaint  Patient presents with  . Follow-up  . Hypertension  . Headache    back left side of head hurts really bad "doing intercourse, when having an orgasm", onset within the last month, has happened before    HPI Comments: Bethany [name] is a 40 y.o. female who [facility]up for HTN. She was seen by Bennett Scrape Jan 23rd. She changed from labetalol when she was pregnant, to hyzaar after he was born 100/12.5 mg. Her numbers at home are about the same as in the office. She denies any drug or alcohol use. Lab Results  Component Value Date   CREATININE 0.69 08/27/2015   Her headache was noted within the past month. She reports twp episodes of immediate headache in the left occipital part her head that's associated with intercourse. She reports the headache as "two steps down from calling 911" and last 15 min after her orgasm. The last headache was in the end of March. She hasn't done a full workout since the birth of her baby, but she's been walking and hasn't experienced a headache then. She doesn't have a family hx of aneurysms. Her mom has hx of HTN, and was adopted. She mentioned having a cavity filled within the past month.  Patient Active Problem List   Diagnosis Date Noted  . Placental abruption 02/01/2015  . Severe preeclampsia 02/01/2015  . H/O cesarean section 01/29/2015  . Uterine fibroid   . Hypertension 06/23/2013  . PCOS (polycystic ovarian syndrome) 06/23/2013  . Palpitations 06/23/2013   Past Medical History  Diagnosis Date  . GERD (gastroesophageal reflux disease)   . Hypertension    . Anemia   . Chronic hypertension during pregnancy, antepartum 02/01/2015  . Uterine hemorrhage 02/01/2015   Past Surgical History  Procedure Laterality Date  . Hernia repair    . Cesarean section N/A 01/29/2015    Procedure: CESAREAN SECTION;  Surgeon: Newton Pigg, MD;  Location: [facility];  Service: Obstetrics;  Laterality: N/A;   No Known Allergies Prior to Admission medications   Medication Sig Start Date End Date Taking? Authorizing Provider  losartan-hydrochlorothiazide (HYZAAR) 100-12.5 MG tablet Take 1 tablet by mouth daily. 08/27/15  Yes Ezekiel Slocumb, PA-C   Social History   Social History  . Marital Status: Married    Spouse Name: N/A  . Number of Children: N/A  . Years of Education: N/A   Occupational History  . Not on file.   Social History Main Topics  . Smoking status: Never Smoker   . Smokeless tobacco: Not on file  . Alcohol Use: No  . Drug Use: No  . Sexual Activity: Yes    Birth Control/ Protection: None   Other Topics Concern  . Not on file   Social History Narrative   Review of Systems  Constitutional: Negative for fatigue and unexpected weight change.  Eyes: Negative for visual disturbance.  Respiratory: Negative for chest tightness and shortness of breath.   Cardiovascular: Negative for chest pain, palpitations and leg swelling.  Gastrointestinal: Negative for abdominal pain and blood in stool.  Neurological: Positive  for headaches. Negative for dizziness, syncope, speech difficulty, weakness (non-focal neuro exam) and light-headedness.     Objective:   Physical Exam  Constitutional: She is oriented to person, place, and time. She appears well-developed and well-nourished.  HENT:  Head: Normocephalic and atraumatic.  Eyes: Conjunctivae and EOM are normal. Pupils are equal, round, and reactive to light.  No nystagmus  Neck: Carotid bruit is not present.  Cardiovascular: Normal rate, regular rhythm, normal heart sounds and intact distal  pulses.   Pulmonary/Chest: Effort normal and breath sounds normal.  Abdominal: Soft. She exhibits no pulsatile midline mass. There is no tenderness.  Neurological: She is alert and oriented to person, place, and time. She displays a negative Romberg sign.  No pronator drift  Skin: Skin is warm and dry.  Psychiatric: She has a normal mood and affect. Her behavior is normal.  Vitals reviewed.   Assessment & Plan:   [name] is a 40 y.o. female Essential hypertension - Plan: Basic metabolic panel, losartan-hydrochlorothiazide (HYZAAR) 100-25 MG tablet  -Check BMP, increase HCTZ component to 25 mg. Continue losartan component 100 mg. Check home readings, RTC precautions. Recheck in next 1 month.  Headache associated with sexual activity - Plan: MR MRA Head/Brain Wo Cm  -Few episodes only, but will rule out more concerning findings with MRI/MRA. Nonfocal neuro exam.  Meds ordered this encounter  Medications  . losartan-hydrochlorothiazide (HYZAAR) 100-25 MG tablet    Sig: Take 1 tablet by mouth daily.    Dispense:  90 tablet    Refill:  1   Patient Instructions       IF you received an x-ray today, you will receive an invoice from [facility]. Please contact [facility] at [phone] with questions or concerns regarding your invoice.   IF you received labwork today, you will receive an invoice from [employer]. Please contact [facility] at [phone] with questions or concerns regarding your invoice.   Our billing staff will not be able to assist you with questions regarding bills from these companies.  You will be contacted with the lab results as soon as they are available. The fastest way to get your results is to activate your My Chart account. Instructions are located on the last page of this paperwork. If you have not heard from [place] regarding the results in 2 weeks, please contact this office.    I will order MRI of brain  for headaches, but return to discuss these further after testing performed.  Increasing dose of blood pressure med for now.  Recheck in next 1 month.   Return to the clinic or go to the nearest emergency room if any of your symptoms worsen or new symptoms occur.     I personally performed the services described in this documentation, which was scribed in my presence. The recorded information has been reviewed and considered, and addended by me as needed.

## 2015-11-21 NOTE — Patient Instructions (Addendum)
     IF you received an x-ray today, you will receive an invoice from [facility]. Please contact [facility] at [phone] with questions or concerns regarding your invoice.   IF you received labwork today, you will receive an invoice from [employer]. Please contact [facility] at [phone] with questions or concerns regarding your invoice.   Our billing staff will not be able to assist you with questions regarding bills from these companies.  You will be contacted with the lab results as soon as they are available. The fastest way to get your results is to activate your My Chart account. Instructions are located on the last page of this paperwork. If you have not heard from [place] regarding the results in 2 weeks, please contact this office.    I will order MRI of brain for headaches, but return to discuss these further after testing performed.  Increasing dose of blood pressure med for now.  Recheck in next 1 month.   Return to the clinic or go to the nearest emergency room if any of your symptoms worsen or new symptoms occur.

## 2015-11-26 ENCOUNTER — Encounter: Payer: Self-pay | Admitting: *Deleted

## 2015-12-03 ENCOUNTER — Other Ambulatory Visit: Payer: Self-pay | Admitting: *Deleted

## 2015-12-03 DIAGNOSIS — G4482 Headache associated with sexual activity: Secondary | ICD-10-CM

## 2015-12-04 ENCOUNTER — Other Ambulatory Visit: Payer: Self-pay | Admitting: *Deleted

## 2015-12-04 DIAGNOSIS — G4482 Headache associated with sexual activity: Secondary | ICD-10-CM

## 2015-12-06 ENCOUNTER — Inpatient Hospital Stay: Admission: RE | Admit: 2015-12-06 | Payer: BLUE CROSS/BLUE SHIELD | Source: Ambulatory Visit

## 2015-12-11 ENCOUNTER — Other Ambulatory Visit: Payer: Self-pay | Admitting: *Deleted

## 2015-12-11 DIAGNOSIS — G4482 Headache associated with sexual activity: Secondary | ICD-10-CM

## 2015-12-12 ENCOUNTER — Ambulatory Visit
Admission: RE | Admit: 2015-12-12 | Discharge: 2015-12-12 | Disposition: A | Discharge status: Home / Self Care | Payer: BLUE CROSS/BLUE SHIELD | Source: Ambulatory Visit | Attending: Family Medicine | Admitting: Family Medicine

## 2015-12-12 ENCOUNTER — Other Ambulatory Visit: Payer: BLUE CROSS/BLUE SHIELD

## 2015-12-12 DIAGNOSIS — G4482 Headache associated with sexual activity: Secondary | ICD-10-CM

## 2015-12-20 ENCOUNTER — Other Ambulatory Visit: Payer: Self-pay | Admitting: Family Medicine

## 2015-12-20 DIAGNOSIS — G4482 Headache associated with sexual activity: Secondary | ICD-10-CM

## 2015-12-23 IMAGING — US US OB LIMITED
1 series · 13 of 28 positions shown · non-contrast
Comparison: none

[Series 1: us ob limited · 44 acquisitions, 13 frames shown]
[im 2/44]
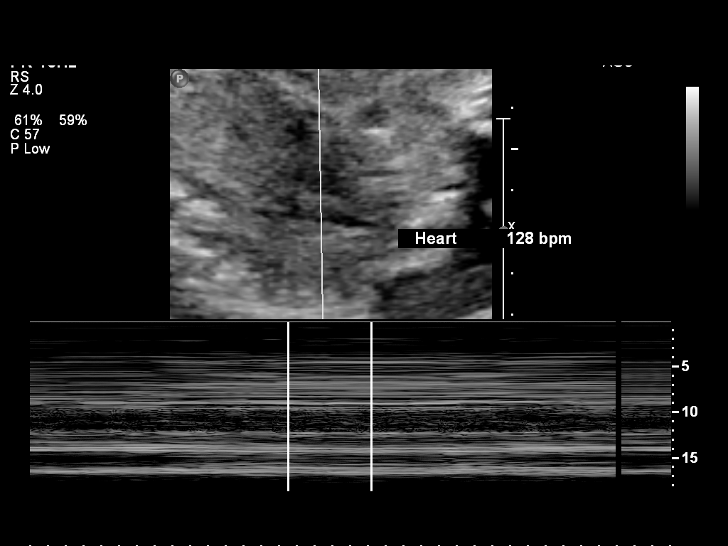
[im 5/44]
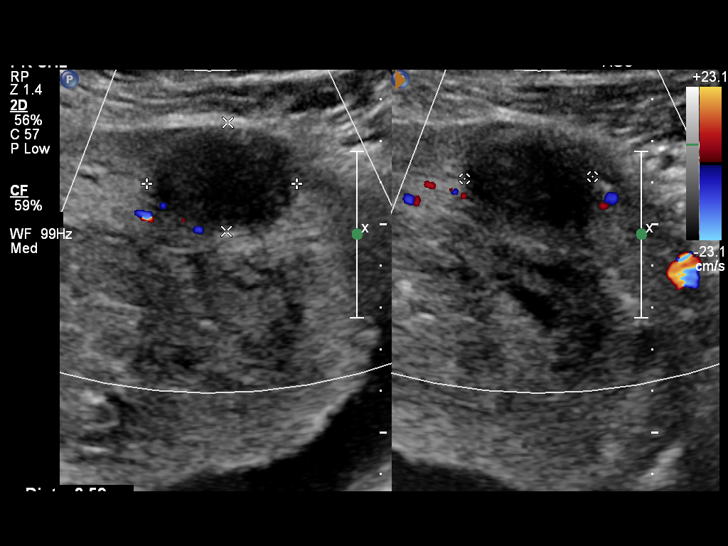
[im 8/44]
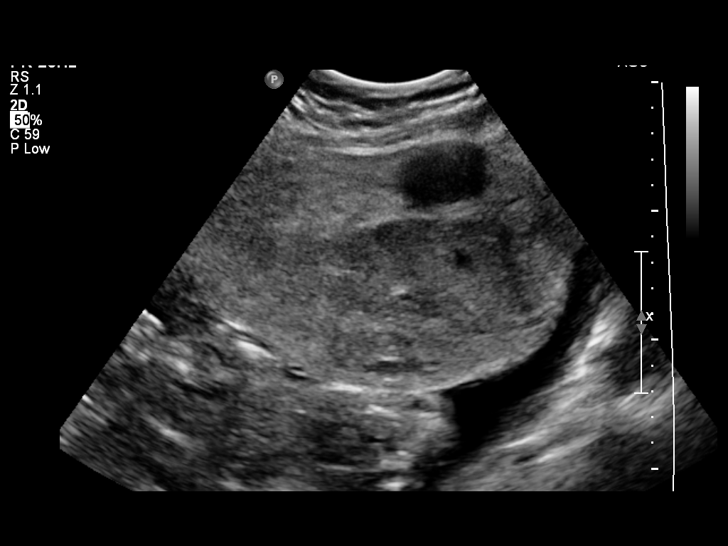
[im 12/44]
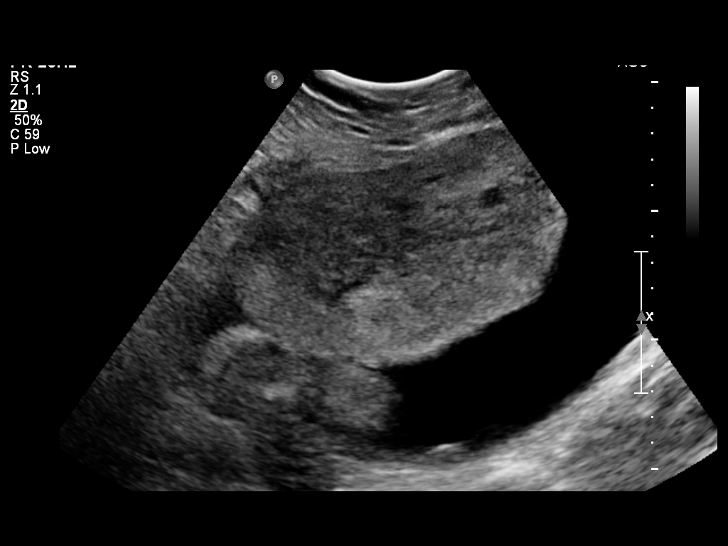
[im 15/44]
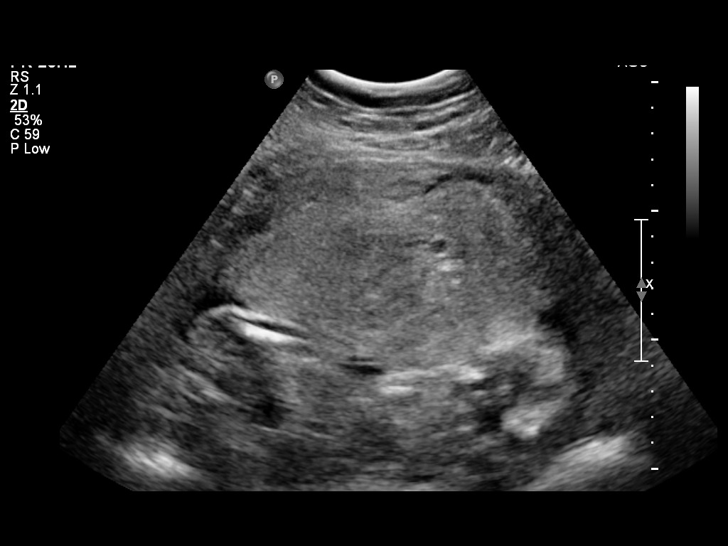
[im 18/44]
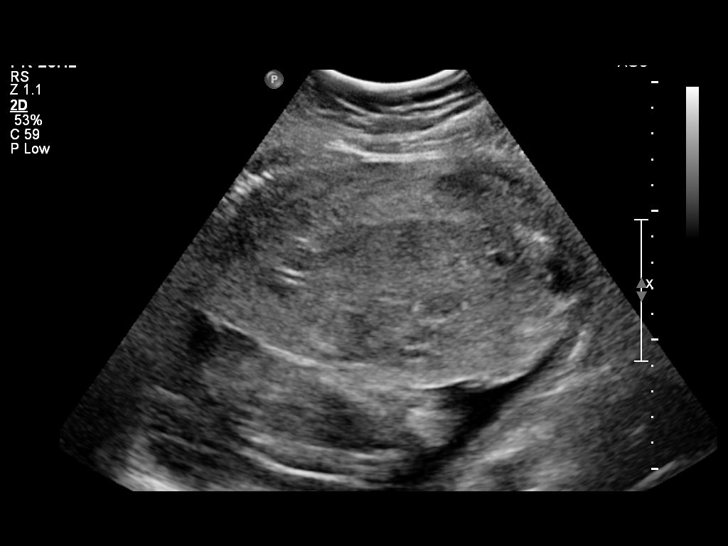
[im 23/44]
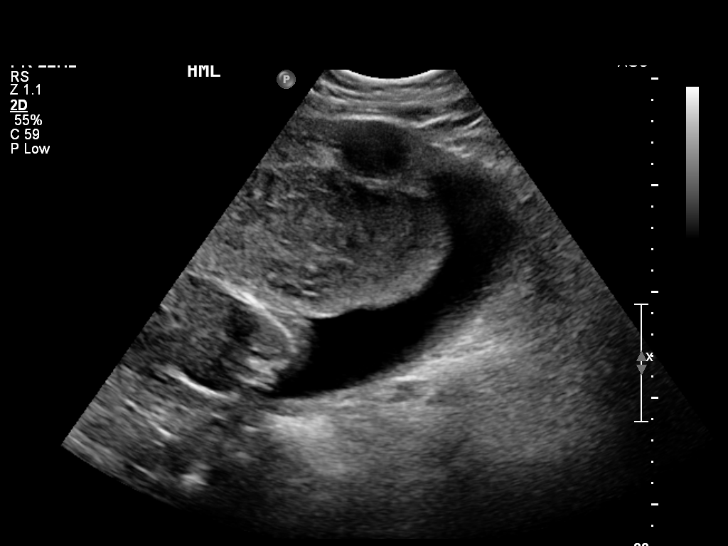
[im 26/44]
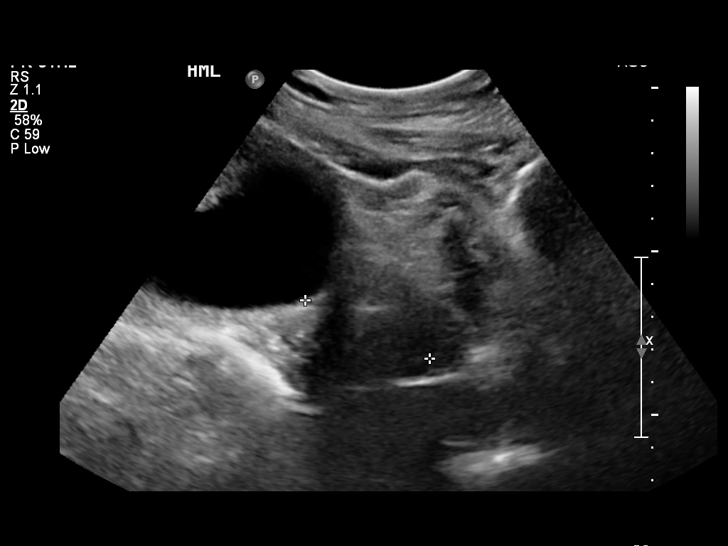
[im 29/44]
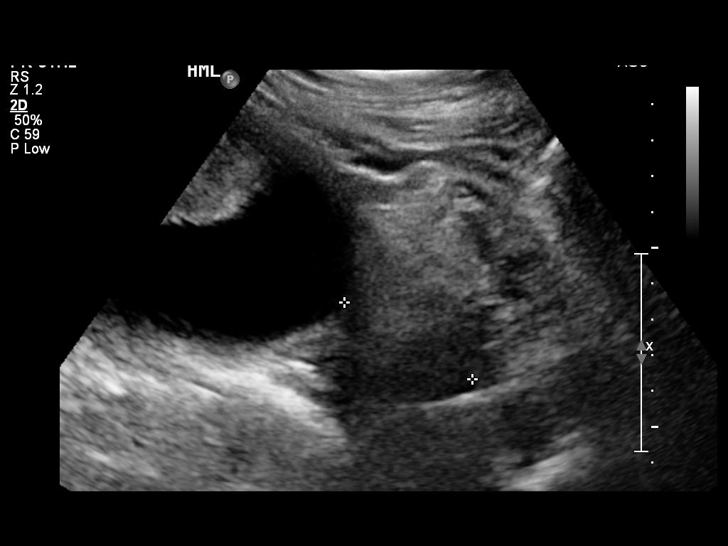
[im 32/44]
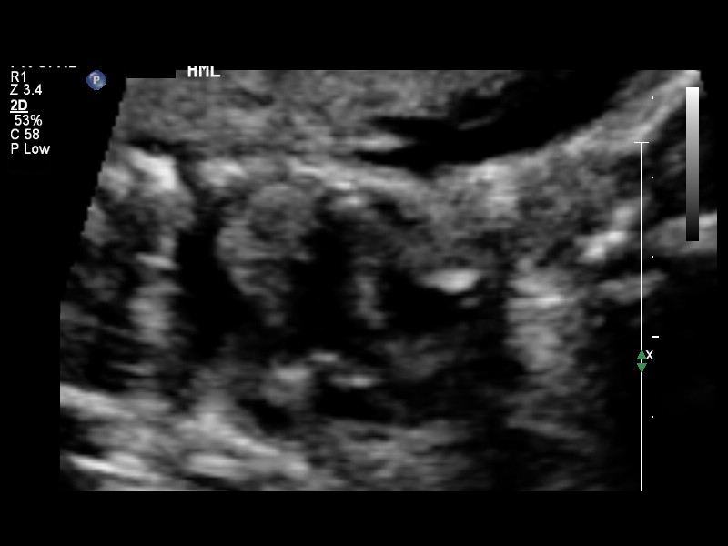
[im 36/44]
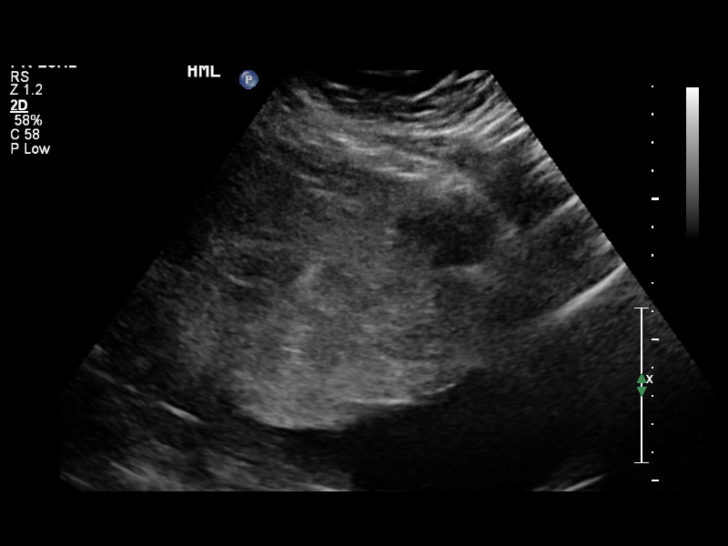
[im 39/44]
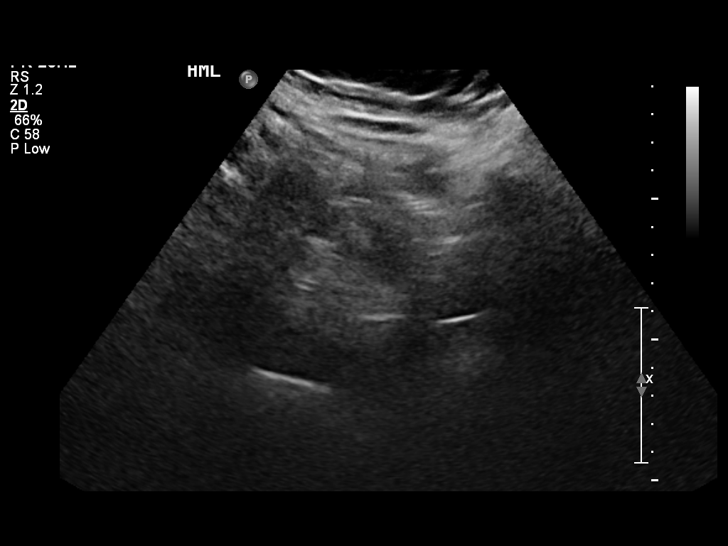
[im 42/44]
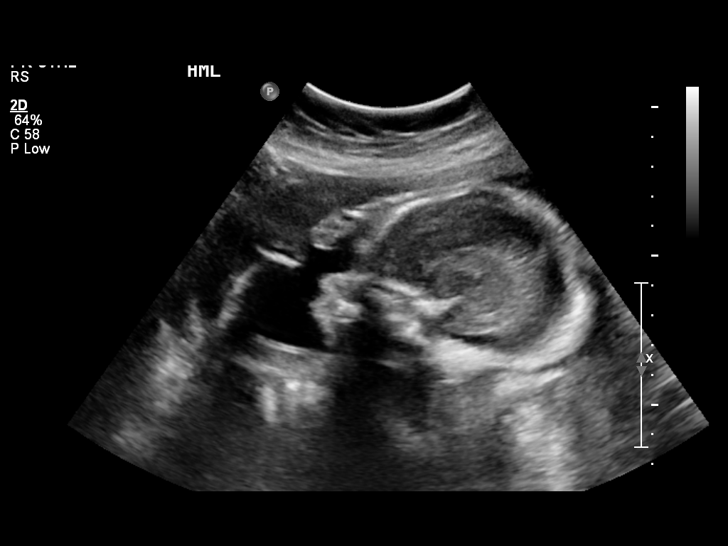

[13 of 28 positions shown; findings below may reference images not displayed]

OBSTETRICS REPORT
(Signed Final 01/29/2015 [DATE])

Service(s) Provided

[HOSPITAL]                                         76815.0
Indications

24 weeks gestation of pregnancy
Hypertension - Gestational, Labetalol
Poor obstetric history (prior pre-term labor)
Poor obstetric history: Previous preeclampsia /
eclampsia/gestational HTN
Absent fetal heart tones
Fetal Evaluation

Num Of Fetuses:    1
Fetal Heart Rate:  130                          bpm
Cardiac Activity:  Observed
Presentation:      Transverse, head to
maternal left
Placenta:          Anterior, above cervical os

Amniotic Fluid
AFI FV:      Subjectively within normal limits
Larg Pckt:    8.38  cm
Gestational Age

Clinical EDD:  24w 5d                                        EDD:   05/16/15
Best:          24w 5d     Det. By:  Clinical EDD             EDD:   05/16/15
Cervix Uterus Adnexa

Cervical Length:    4.14     cm

Cervix:       Normal appearance by transabdominal scan.
Uterus:       Multiple fibroids noted, see table below.
Left Ovary:    Not visualized.
Right Ovary:   Within normal limits.
Myomas

Site                     L(cm)      W(cm)       D(cm)      Location
Anterior
Anterior
Blood Flow                  RI       PI       Comments

Impression

Single IUP at 24w 5d
Limited ultrasound performed due to inability to find fetal
heart tones
Transverse lie
Fetal heart rate of 130 bpm noted
Anterior myomas noted as described above
An anterior placenta is noted.  While there is no clear
subchorionic fluid collection, the placenta appears thickened
and heterogeneous and may be consistent with placental
abruption
Normal amniotic fluid volume
Recommendations

Follow-up ultrasounds as clinically indicated.

questions or concerns.

## 2016-01-08 ENCOUNTER — Ambulatory Visit: Payer: BLUE CROSS/BLUE SHIELD | Admitting: Diagnostic Neuroimaging

## 2016-01-17 ENCOUNTER — Ambulatory Visit: Payer: BLUE CROSS/BLUE SHIELD | Admitting: Family Medicine

## 2016-02-18 ENCOUNTER — Ambulatory Visit (INDEPENDENT_AMBULATORY_CARE_PROVIDER_SITE_OTHER): Payer: BLUE CROSS/BLUE SHIELD

## 2016-02-18 ENCOUNTER — Ambulatory Visit (INDEPENDENT_AMBULATORY_CARE_PROVIDER_SITE_OTHER): Payer: BLUE CROSS/BLUE SHIELD | Admitting: Urgent Care

## 2016-02-18 VITALS — BP 140/100 | HR 82 | Temp 99.2°F | Resp 18 | Ht 66.5 in | Wt 231.0 lb

## 2016-02-18 DIAGNOSIS — R030 Elevated blood-pressure reading, without diagnosis of hypertension: Secondary | ICD-10-CM

## 2016-02-18 DIAGNOSIS — I10 Essential (primary) hypertension: Secondary | ICD-10-CM | POA: Diagnosis not present

## 2016-02-18 DIAGNOSIS — R0789 Other chest pain: Secondary | ICD-10-CM

## 2016-02-18 DIAGNOSIS — R002 Palpitations: Secondary | ICD-10-CM

## 2016-02-18 DIAGNOSIS — D649 Anemia, unspecified: Secondary | ICD-10-CM

## 2016-02-18 DIAGNOSIS — Z8249 Family history of ischemic heart disease and other diseases of the circulatory system: Secondary | ICD-10-CM

## 2016-02-18 LAB — POCT CBC
Granulocyte percent: 77.8 %G (ref 37–80)
HEMATOCRIT: 32.2 % — AB (ref 37.7–47.9)
HEMOGLOBIN: 11 g/dL — AB (ref 12.2–16.2)
LYMPH, POC: 2.1 (ref 0.6–3.4)
MCH, POC: 28 pg (ref 27–31.2)
MCHC: 34.1 g/dL (ref 31.8–35.4)
MCV: 81.9 fL (ref 80–97)
MID (cbc): 0.2 (ref 0–0.9)
MPV: 7.4 fL (ref 0–99.8)
PLATELET COUNT, POC: 327 10*3/uL (ref 142–424)
POC GRANULOCYTE: 8 — AB (ref 2–6.9)
POC LYMPH PERCENT: 20.1 %L (ref 10–50)
POC MID %: 2.1 %M (ref 0–12)
RBC: 3.92 M/uL — AB (ref 4.04–5.48)
RDW, POC: 16.4 %
WBC: 10.3 10*3/uL — AB (ref 4.6–10.2)

## 2016-02-18 NOTE — Patient Instructions (Addendum)
Palpitations A palpitation is the feeling that your heartbeat is irregular or is faster than normal. It may feel like your heart is fluttering or skipping a beat. Palpitations are usually not a serious problem. However, in some cases, you may need further medical evaluation. CAUSES  Palpitations can be caused by:  Smoking.  Caffeine or other stimulants, such as diet pills or energy drinks.  Alcohol.  Stress and anxiety.  Strenuous physical activity.  Fatigue.  Certain medicines.  Heart disease, especially if you have a history of irregular heart rhythms (arrhythmias), such as atrial fibrillation, atrial flutter, or supraventricular tachycardia.  An improperly working pacemaker or defibrillator. DIAGNOSIS  To find the cause of your palpitations, your health care provider will take your medical history and perform a physical exam. Your health care provider may also have you take a test called an ambulatory electrocardiogram (ECG). An ECG records your heartbeat patterns over a 24-hour period. You may also have other tests, such as:  Transthoracic echocardiogram (TTE). During echocardiography, sound waves are used to evaluate how blood flows through your heart.  Transesophageal echocardiogram (TEE).  Cardiac monitoring. This allows your health care provider to monitor your heart rate and rhythm in real time.  Holter monitor. This is a portable device that records your heartbeat and can help diagnose heart arrhythmias. It allows your health care provider to track your heart activity for several days, if needed.  Stress tests by exercise or by giving medicine that makes the heart beat faster. TREATMENT  Treatment of palpitations depends on the cause of your symptoms and can vary greatly. Most cases of palpitations do not require any treatment other than time, relaxation, and monitoring your symptoms. Other causes, such as atrial fibrillation, atrial flutter, or supraventricular  tachycardia, usually require further treatment. HOME CARE INSTRUCTIONS   Avoid:  Caffeinated coffee, tea, soft drinks, diet pills, and energy drinks.  Chocolate.  Alcohol.  Stop smoking if you smoke.  Reduce your stress and anxiety. Things that can help you relax include:  A method of controlling things in your body, such as your heartbeats, with your mind (biofeedback).  Yoga.  Meditation.  Physical activity such as swimming, jogging, or walking.  Get plenty of rest and sleep. SEEK MEDICAL CARE IF:   You continue to have a fast or irregular heartbeat beyond 24 hours.  Your palpitations occur more often. SEEK IMMEDIATE MEDICAL CARE IF:  You have chest pain or shortness of breath.  You have a severe headache.  You feel dizzy or you faint. MAKE SURE YOU:  Understand these instructions.  Will watch your condition.  Will get help right away if you are not doing well or get worse.   This information is not intended to replace advice given to you by your health care provider. Make sure you discuss any questions you have with your health care provider.   Document Released: 07/18/2000 Document Revised: 07/26/2013 Document Reviewed: 09/19/2011 Elsevier Interactive Patient Education 2016 [employer].     IF you received an x-ray today, you will receive an invoice from [facility]. Please contact [facility] at [phone] with questions or concerns regarding your invoice.   IF you received labwork today, you will receive an invoice from [employer]. Please contact [facility] at [phone] with questions or concerns regarding your invoice.   Our billing staff will not be able to assist you with questions regarding bills from these companies.  You will be contacted with the lab results as  soon as they are available. The fastest way to get your results is to activate your My Chart account. Instructions are located on the last  page of this paperwork. If you have not heard from [place] regarding the results in 2 weeks, please contact this office.

## 2016-02-18 NOTE — Progress Notes (Signed)
MRN: [id number] DOB: 01-Mar-1976  Subjective:   Bethany [name] is a 40 y.o. female presenting for chief complaint of Palpitations  Reports several week history of recurrent palpitations, burning sensation in her chest that is mildly painful, chills. Patient has a history of HTN, manages this well with losartan-HCTZ but admits that she forgot her BP medication dose this morning. Of note, patient had Holter monitor work up ~5 years ago and was normal after 30 days, she has not followed up since then. Denies dizziness, chronic headache, blurred vision, shortness of breath, heart racing, palpitations, nausea, vomiting, abdominal pain, hematuria, lower leg swelling. Denies smoking cigarettes. Family history is positive for MI in her father in his early 52's.   Bethany [name] has a current medication list which includes the following prescription(s): losartan-hydrochlorothiazide. Also has No Known Allergies.  Bethany [name]  has a past medical history of GERD (gastroesophageal reflux disease); Hypertension; Anemia; Chronic hypertension during pregnancy, antepartum (02/01/2015); and Uterine hemorrhage (02/01/2015). Also  has past surgical history that includes Hernia repair and Cesarean section (N/A, 01/29/2015).  Her family history includes Cancer in her father; Diabetes in her brother and father; Heart attack in her paternal grandfather and paternal grandmother; Hypertension in her father and mother; Kidney failure in her brother.   Objective:   Vitals: BP 140/100 mmHg  Pulse 82  Temp(Src) 99.2 F (37.3 C) (Oral)  Resp 18  Ht 5' 6.5" (1.689 m)  Wt 231 lb (104.781 kg)  BMI 36.73 kg/m2  SpO2 100%  LMP 02/04/2016 (Approximate)  Physical Exam  Constitutional: She is oriented to person, place, and time. She appears well-developed and well-nourished.  HENT:  Mouth/Throat: Oropharynx is clear and moist.  Eyes: No scleral icterus.  Neck: Normal range of motion. Neck supple. No thyromegaly present.  Cardiovascular:  Normal rate, regular rhythm and intact distal pulses.  Exam reveals no gallop and no friction rub.   Murmur (Grade I/VI systolic ejection murmur (not new) best heard a LUSB) heard. Pulmonary/Chest: No respiratory distress. She has no wheezes. She has no rales.  Musculoskeletal: She exhibits no edema.  Neurological: She is alert and oriented to person, place, and time.  Skin: Skin is warm and dry.   ECG interpretation - sinus rhythm.  Dg Chest 2 View  02/18/2016  CLINICAL DATA:  Atypical chest pain and palpitations. Essential hypertension. EXAM: CHEST  2 VIEW COMPARISON:  12/01/2012 FINDINGS: The cardiomediastinal contours are normal. The lungs are clear. Pulmonary vasculature is normal. No consolidation, pleural effusion, or pneumothorax. No acute osseous abnormalities are seen. IMPRESSION: No acute pulmonary process. Electronically Signed   By: Jeb Levering M.D.   On: 02/18/2016 18:31     Results for orders placed or performed in visit on 02/18/16 (from the past 24 hour(s))  POCT CBC     Status: Abnormal   Collection Time: 02/18/16  6:04 PM  Result Value Ref Range   WBC 10.3 (A) 4.6 - 10.2 K/uL   Lymph, poc 2.1 0.6 - 3.4   POC LYMPH PERCENT 20.1 10 - 50 %L   MID (cbc) 0.2 0 - 0.9   POC MID % 2.1 0 - 12 %M   POC Granulocyte 8.0 (A) 2 - 6.9   Granulocyte percent 77.8 37 - 80 %G   RBC 3.92 (A) 4.04 - 5.48 M/uL   Hemoglobin 11.0 (A) 12.2 - 16.2 g/dL   HCT, POC 32.2 (A) 37.7 - 47.9 %   MCV 81.9 80 - 97 fL   MCH, POC  28.0 27 - 31.2 pg   MCHC 34.1 31.8 - 35.4 g/dL   RDW, POC 16.4 %   Platelet Count, POC 327 142 - 424 K/uL   MPV 7.4 0 - 99.8 fL   Assessment and Plan :   1. Palpitations 2. Atypical chest pain 3. Essential hypertension 4. Elevated blood pressure reading 5. Family history of MI (myocardial infarction) 72. Family history of heart disease - ECG, x-ray findings, physical exam reassuring. Referral to cardiology pending given palpitations, HTN, family hx of MI.   7.  Anemia, unspecified anemia type - Ferritin - Pathologist smear review - Recommended patient eat iron-rich foods, consider referral to GI.   Jaynee Eagles, PA-C [facility] [phone] 02/18/2016 5:47 PM

## 2016-02-19 LAB — COMPLETE METABOLIC PANEL WITH GFR
ALBUMIN: 4.4 g/dL (ref 3.6–5.1)
ALK PHOS: 83 U/L (ref 33–115)
ALT: 15 U/L (ref 6–29)
AST: 18 U/L (ref 10–30)
BUN: 14 mg/dL (ref 7–25)
CALCIUM: 9.7 mg/dL (ref 8.6–10.2)
CHLORIDE: 100 mmol/L (ref 98–110)
CO2: 25 mmol/L (ref 20–31)
CREATININE: 0.9 mg/dL (ref 0.50–1.10)
GFR, Est African American: >89 mL/min (ref >=60)
GFR, Est Non African American: 81 mL/min (ref >=60)
GLUCOSE: 89 mg/dL (ref 65–99)
Potassium: 4.1 mmol/L (ref 3.5–5.3)
SODIUM: 137 mmol/L (ref 135–146)
Total Bilirubin: 0.3 mg/dL (ref 0.2–1.2)
Total Protein: 7.7 g/dL (ref 6.1–8.1)

## 2016-02-19 LAB — FERRITIN: Ferritin: 8 ng/mL — ABNORMAL LOW (ref 10–154)

## 2016-02-19 LAB — TSH: TSH: 0.83 mIU/L

## 2016-02-20 ENCOUNTER — Telehealth: Payer: Self-pay | Admitting: Urgent Care

## 2016-02-20 LAB — PATHOLOGIST SMEAR REVIEW

## 2016-02-20 NOTE — Telephone Encounter (Signed)
Patient wants to restart her iron supplement, will use laxative or stool softener as well. Does not want GI referral at the moment. Will f/u in 3 months.

## 2016-05-18 ENCOUNTER — Other Ambulatory Visit: Payer: Self-pay | Admitting: Family Medicine

## 2016-05-18 DIAGNOSIS — I10 Essential (primary) hypertension: Secondary | ICD-10-CM

## 2016-05-19 ENCOUNTER — Other Ambulatory Visit: Payer: Self-pay | Admitting: Family Medicine

## 2016-05-19 DIAGNOSIS — I10 Essential (primary) hypertension: Secondary | ICD-10-CM

## 2016-07-10 ENCOUNTER — Ambulatory Visit (INDEPENDENT_AMBULATORY_CARE_PROVIDER_SITE_OTHER): Payer: BLUE CROSS/BLUE SHIELD | Admitting: Family Medicine

## 2016-07-10 ENCOUNTER — Encounter: Payer: Self-pay | Admitting: Family Medicine

## 2016-07-10 VITALS — BP 138/80 | HR 87 | Temp 98.7°F | Resp 18 | Ht 66.5 in | Wt 233.8 lb

## 2016-07-10 DIAGNOSIS — Z131 Encounter for screening for diabetes mellitus: Secondary | ICD-10-CM

## 2016-07-10 DIAGNOSIS — E282 Polycystic ovarian syndrome: Secondary | ICD-10-CM

## 2016-07-10 DIAGNOSIS — K439 Ventral hernia without obstruction or gangrene: Secondary | ICD-10-CM

## 2016-07-10 DIAGNOSIS — D649 Anemia, unspecified: Secondary | ICD-10-CM | POA: Insufficient documentation

## 2016-07-10 DIAGNOSIS — I10 Essential (primary) hypertension: Secondary | ICD-10-CM

## 2016-07-10 NOTE — Progress Notes (Signed)
By signing my name below, I, Bethany [name], attest that this documentation has been prepared under the direction and in the presence of Bethany Ray, MD.  Electronically Signed: Verlee [name], Medical Scribe. 07/10/16. 12:20 PM.  Subjective:    Patient ID: Bethany [name], female    DOB: Dec 22, 1975, 40 y.o.   MRN: [id number]  HPI Chief Complaint  Patient presents with  . Abdominal Pain    where her hernai was (had baby 1 year ago)    HPI Comments: Bethany [name] is a 40 y.o. female who presents to the [facility] complaining of abdominal pain. She had a c-section 01/2016, Bethany [name]. Pt locates the pain where her hernia surgery was; states she got a mesh inserted. This was approximately 2010 or 2011. reports it's tender to the touch, and worsens when she sits up or when she get's gassy. She'll occasionally hold her stomach to "relieve some pressure" for relief of her sxs. Mentions when she delivered her daughter vaginally in 2009 she noticed something wasn't right. She had a hernia surgery the year or 2 later. During her recent pregnancy she noticed a bulge that never went down after the baby's delivery. Denies nausea, emesis, and constipation. Has noticed some discomfort in that area recently. Denies any persistent swelling that does not reduce.  Weight loss: She was last seen by Bethany [name] 7/17.   BP was 140/100 at that time. She was referred to cardiology for evaluation of her palpitations.  She had a nl TSH, anemia with hemoglobin of 11.0, ferritin of 8. She declined a GI referral. Was restarted on iron supplementation - 3 times per day. Compliant with her iron supplement TID and states it's helped  She is also asking about starting Metformin due to hx of PCOS. Pt is concerned about her PCOS affecting her ability to lose weight- she's vegan now but doesn't exercise as much as she should. Mentions she was started on amlodipine by cardiology who didn't find anything  concerning during her visit and told her to lose weight. OB/GYN dx her with PCOS in past. . Pt is on the IUD by her OB/GYN and last saw them over the summer. Reports growing hair on her face, chin with PCOS.    Wt Readings from Last 3 Encounters:  07/10/16 233 lb 12.8 oz (106.1 kg)  02/18/16 231 lb (104.8 kg)  11/21/15 233 lb 6.4 oz (105.9 kg)   Patient Active Problem List   Diagnosis Date Noted  . Placental abruption 02/01/2015  . Severe preeclampsia 02/01/2015  . H/O cesarean section 01/29/2015  . Uterine fibroid   . Hypertension 06/23/2013  . PCOS (polycystic ovarian syndrome) 06/23/2013  . Palpitations 06/23/2013   Past Medical History:  Diagnosis Date  . Anemia   . Chronic hypertension during pregnancy, antepartum 02/01/2015  . GERD (gastroesophageal reflux disease)   . Hypertension   . Uterine hemorrhage 02/01/2015   Past Surgical History:  Procedure Laterality Date  . CESAREAN SECTION N/A 01/29/2015   Procedure: CESAREAN SECTION;  Surgeon: Bethany Pigg, MD;  Location: [facility];  Service: Obstetrics;  Laterality: N/A;  . HERNIA REPAIR     No Known Allergies Prior to Admission medications   Medication Sig Start Date End Date Taking? Authorizing Provider  amLODipine (NORVASC) 5 MG tablet Take 5 mg by mouth daily.   Yes Historical Provider, MD  Ferrous Gluconate-C-Folic Acid (IRON-C PO) Take 65 mg by mouth.   Yes Historical Provider, MD  losartan-hydrochlorothiazide (  HYZAAR) 100-25 MG tablet TAKE 1 TABLET BY MOUTH DAILY. 05/18/16  Yes Bethany Eagles, PA-C   Social History   Social History  . Marital status: Married    Spouse name: N/A  . Number of children: N/A  . Years of education: N/A   Occupational History  . Not on file.   Social History Main Topics  . Smoking status: Never Smoker  . Smokeless tobacco: Never Used  . Alcohol use No  . Drug use: No  . Sexual activity: Yes    Birth control/ protection: None   Other Topics Concern  . Not on file   Social  History Narrative  . No narrative on file   Review of Systems  Constitutional: Negative for unexpected weight change.  Gastrointestinal: Positive for abdominal distention. Negative for constipation, nausea and vomiting.   Objective:  Physical Exam  Constitutional: She is oriented to person, place, and time. She appears well-developed and well-nourished. No distress.  HENT:  Head: Normocephalic and atraumatic.  Eyes: Conjunctivae and EOM are normal. Pupils are equal, round, and reactive to light.  Neck: Neck supple. Carotid bruit is not present.  Cardiovascular: Normal rate, regular rhythm, normal heart sounds and intact distal pulses.   Pulmonary/Chest: Effort normal and breath sounds normal.  Abdominal: Soft. She exhibits no pulsatile midline mass. There is no tenderness. A hernia is present. Hernia confirmed positive in the ventral area.  Small palpable midline defect with buldging when sitting up Over the area of well healed scar which is above umbilicus No umbilicus hernia Skin intact  No erythema   Neurological: She is alert and oriented to person, place, and time.  Skin: Skin is warm, dry and intact.  Psychiatric: She has a normal mood and affect. Her behavior is normal.  Nursing note and vitals reviewed.  BP 138/80 (BP Location: Left Arm, Patient Position: Sitting, Cuff Size: Large)   Pulse 87   Temp 98.7 F (37.1 C) (Oral)   Resp 18   Ht 5' 6.5" (1.689 m)   Wt 233 lb 12.8 oz (106.1 kg)   SpO2 100%   BMI 37.17 kg/m  Assessment & Plan:    Bethany [name] is a 40 y.o. female Ventral hernia without obstruction or gangrene - Plan: Ambulatory referral to General Surgery  - Suspected recurrence of ventral hernia versus rectus diastasis. As now more symptomatic/uncomfortable, refer to Gen. surgery for evaluation. Discussed CT scanning may be needed to clarify if this is present hernia but with previous repair and mesh, initially eval with general surgery. RTC/ER hernia  precautions discussed.  Essential hypertension  - Controlled. No changes for now, but consider changing HCTZ to spironolactone with underlying PCOS. Can discuss at follow-up  PCOS (polycystic ovarian syndrome)  -On IUD. Recommended to increase frequency of exercise and diet handout given for weight loss. Deferred metformin for now, but will screen for diabetes. Consider spironolactone to help with androgenic issues and HTN, but can also discuss this with her OB/GYN. Follow up in 1 month to discuss further.   Anemia, unspecified type - Plan: CBC with Differential/Platelet  - Continue iron, repeat CBC.  Screening for diabetes mellitus - Plan: Hemoglobin A1c  -As above, with underlying PCOS.   Meds ordered this encounter  Medications  . amLODipine (NORVASC) 5 MG tablet    Sig: Take 5 mg by mouth daily.  . Ferrous Gluconate-C-Folic Acid (IRON-C PO)    Sig: Take 65 mg by mouth.   Patient Instructions   I will  refer you to general surgeon for evaluation of the hernia.  See precautions below.   I will recheck the anemia as well as screen for diabetes with the PCOS.  Continue iron supplement at same dose for now.   Exercise 150 minutes per week as initial approach to PCOS. Diet as below.   Follow up with me in next month to review plan for PCOS. One option would be to change one of your blood pressure medicines to spirinolactone. We can discuss this at follow up. No change in meds for now.     Diet for Polycystic Ovarian Syndrome Introduction Polycystic ovary syndrome (PCOS) is a disorder of the chemical messengers (hormones) that regulate menstruation. The condition causes important hormones to be out of balance. PCOS can:  Make your periods irregular or stop.  Cause cysts to develop on the ovaries.  Make it difficult to get pregnant.  Stop your body from responding to the effects of insulin (insulin resistance), which can lead to obesity and diabetes. Changing what you eat can  help manage PCOS and improve your health. It can help you lose weight and improve the way your body uses insulin. What is my plan?  Eat breakfast, lunch, and dinner plus two snacks every day.  Include protein in each meal and snack.  Choose whole grains instead of products made with refined flour.  Eat a variety of foods.  Exercise regularly as told by your health care provider. What do I need to know about this eating plan? If you are overweight or obese, pay attention to how many calories you eat. Cutting down on calories can help you lose weight. Work with your health care provider or dietitian to figure out how many calories you need each day. What foods can I eat? Grains  Whole grains, such as whole wheat. Whole-grain breads, crackers, cereals, and pasta. Unsweetened oatmeal, bulgur, barley, quinoa, or brown rice. Corn or whole-wheat flour tortillas. Vegetables  Lettuce. Spinach. Peas. Beets. Cauliflower. Cabbage. Broccoli. Carrots. Tomatoes. Squash. Eggplant. Herbs. Peppers. Onions. Cucumbers. Brussels sprouts. Fruits  Berries. Bananas. Apples. Oranges. Grapes. Papaya. Mango. Pomegranate. Kiwi. Grapefruit. Cherries. Meats and Other Protein Sources  Lean proteins, such as fish, chicken, beans, eggs, and tofu. Dairy  Low-fat dairy products, such as skim milk, cheese sticks, and yogurt. Beverages  Low-fat or fat-free drinks, such as water, low-fat milk, sugar-free drinks, and 100% fruit juice. Condiments  Ketchup. Mustard. Barbecue sauce. Relish. Low-fat or fat-free mayonnaise. Fats and Oils  Olive oil or canola oil. Walnuts and almonds. The items listed above may not be a complete list of recommended foods or beverages. Contact your dietitian for more options.  What foods are not recommended? Foods high in calories or fat. Fried foods. Sweets. Products made from refined white flour, including white bread, pastries, white rice, and pasta. The items listed above may not be a  complete list of foods and beverages to avoid. Contact your dietitian for more information.  This information is not intended to replace advice given to you by your health care provider. Make sure you discuss any questions you have with your health care provider. Document Released: 11/12/2015 Document Revised: 12/27/2015 Document Reviewed: 08/02/2014  2017 Elsevier    Polycystic Ovarian Syndrome Polycystic ovarian syndrome (PCOS) is a common hormonal disorder among women of reproductive age. In most women with PCOS, many small fluid-filled sacs (cysts) grow on the ovaries, and the cysts are not part of a normal menstrual cycle. PCOS can cause problems with  your menstrual periods and make it difficult to get pregnant. It can also cause an increased risk of miscarriage with pregnancy. If it is not treated, PCOS can lead to serious health problems, such as diabetes and heart disease. What are the causes? The cause of PCOS is not known, but it may be the result of a combination of certain factors, such as:  Irregular menstrual cycle.  High levels of certain hormones (androgens).  Problems with the hormone that helps to control blood sugar (insulin resistance).  Certain genes. What increases the risk? This condition is more likely to develop in women who have a family history of PCOS. What are the signs or symptoms? Symptoms of PCOS may include:  Multiple ovarian cysts.  Infrequent periods or no periods.  Periods that are too frequent or too heavy.  Unpredictable periods.  Inability to get pregnant (infertility) because of not ovulating.  Increased growth of hair on the face, chest, stomach, back, thumbs, thighs, or toes.  Acne or oily skin. Acne may develop during adulthood, and it may not respond to treatment.  Pelvic pain.  Weight gain or obesity.  Patches of thickened and dark brown or black skin on the neck, arms, breasts, or thighs (acanthosis nigricans).  Excess hair  growth on the face, chest, abdomen, or upper thighs (hirsutism). How is this diagnosed? This condition is diagnosed based on:  Your medical history.  A physical exam, including a pelvic exam. Your health care provider may look for areas of increased hair growth on your skin.  Tests, such as:  Ultrasound. This may be used to examine the ovaries and the lining of the uterus (endometrium) for cysts.  Blood tests. These may be used to check levels of sugar (glucose), female hormone (testosterone), and female hormones (estrogen and progesterone) in your blood. How is this treated? There is no cure for PCOS, but treatment can help to manage symptoms and prevent more health problems from developing. Treatment varies depending on:  Your symptoms.  Whether you want to have a baby or whether you need birth control (contraception). Treatment may include nutrition and lifestyle changes along with:  Progesterone hormone to start a menstrual period.  Birth control pills to help you have regular menstrual periods.  Medicines to make you ovulate, if you want to get pregnant.  Medicine to reduce excessive hair growth.  Surgery, in severe cases. This may involve making small holes in one or both of your ovaries. This decreases the amount of testosterone that your body produces. Follow these instructions at home:  Take over-the-counter and prescription medicines only as told by your health care provider.  Follow a healthy meal plan. This can help you reduce the effects of PCOS.  Eat a healthy diet that includes lean proteins, complex carbohydrates, fresh fruits and vegetables, low-fat dairy products, and healthy fats. Make sure to eat enough fiber.  If you are overweight, lose weight as told by your health care provider.  Losing 10% of your body weight may improve symptoms.  Your health care provider can determine how much weight loss is best for you and can help you lose weight safely.  Keep  all follow-up visits as told by your health care provider. This is important. Contact a health care provider if:  Your symptoms do not get better with medicine.  You develop new symptoms. This information is not intended to replace advice given to you by your health care provider. Make sure you discuss any questions you have  with your health care provider. Document Released: 11/14/2004 Document Revised: 03/18/2016 Document Reviewed: 01/06/2016 Elsevier Interactive Patient Education  2017 [place], Adult A hernia is the bulging of an organ or tissue through a weak spot in the muscles of the abdomen (abdominal wall). Hernias develop most often near the navel or groin. There are many kinds of hernias. Common kinds include:  Femoral hernia. This kind of hernia develops under the groin in the upper thigh area.  Inguinal hernia. This kind of hernia develops in the groin or scrotum.  Umbilical hernia. This kind of hernia develops near the navel.  Hiatal hernia. This kind of hernia causes part of the stomach to be pushed up into the chest.  Incisional hernia. This kind of hernia bulges through a scar from an abdominal surgery. What are the causes? This condition may be caused by:  Heavy lifting.  Coughing over a long period of time.  Straining to have a bowel movement.  An incision made during an abdominal surgery.  A birth defect (congenital defect).  Excess weight or obesity.  Smoking.  Poor nutrition.  Cystic fibrosis.  Excess fluid in the abdomen.  Undescended testicles. What are the signs or symptoms? Symptoms of a hernia include:  A lump on the abdomen. This is the first sign of a hernia. The lump may become more obvious with standing, straining, or coughing. It may get bigger over time if it is not treated or if the condition causing it is not treated.  Pain. A hernia is usually painless, but it may become painful over time if treatment is  delayed. The pain is usually dull and may get worse with standing or lifting heavy objects. Sometimes a hernia gets tightly squeezed in the weak spot (strangulated) or stuck there (incarcerated) and causes additional symptoms. These symptoms may include:  Vomiting.  Nausea.  Constipation.  Irritability. How is this diagnosed? A hernia may be diagnosed with:  A physical exam. During the exam your health care provider may ask you to cough or to make a specific movement, because a hernia is usually more visible when you move.  Imaging tests. These can include:  X-rays.  Ultrasound.  CT scan. How is this treated? A hernia that is small and painless may not need to be treated. A hernia that is large or painful may be treated with surgery. Inguinal hernias may be treated with surgery to prevent incarceration or strangulation. Strangulated hernias are always treated with surgery, because lack of blood to the trapped organ or tissue can cause it to die. Surgery to treat a hernia involves pushing the bulge back into place and repairing the weak part of the abdomen. Follow these instructions at home:  Avoid straining.  Do not lift anything heavier than 10 lb (4.5 kg).  Lift with your leg muscles, not your back muscles. This helps avoid strain.  When coughing, try to cough gently.  Prevent constipation. Constipation leads to straining with bowel movements, which can make a hernia worse or cause a hernia repair to break down. You can prevent constipation by:  Eating a high-fiber diet that includes plenty of fruits and vegetables.  Drinking enough fluids to keep your urine clear or pale yellow. Aim to drink 6-8 glasses of water per day.  Using a stool softener as directed by your health care provider.  Lose weight, if you are overweight.  Do not use any tobacco products, including cigarettes, chewing tobacco, or electronic  cigarettes. If you need help quitting, ask your health care  provider.  Keep all follow-up visits as directed by your health care provider. This is important. Your health care provider may need to monitor your condition. Contact a health care provider if:  You have swelling, redness, and pain in the affected area.  Your bowel habits change. Get help right away if:  You have a fever.  You have abdominal pain that is getting worse.  You feel nauseous or you vomit.  You cannot push the hernia back in place by gently pressing on it while you are lying down.  The hernia:  Changes in shape or size.  Is stuck outside the abdomen.  Becomes discolored.  Feels hard or tender. This information is not intended to replace advice given to you by your health care provider. Make sure you discuss any questions you have with your health care provider. Document Released: 07/21/2005 Document Revised: 12/19/2015 Document Reviewed: 05/31/2014 Elsevier Interactive Patient Education  2017 [employer].       IF you received an x-[name] today, you will receive an invoice from [facility]. Please contact [facility] at [phone] with questions or concerns regarding your invoice.   IF you received labwork today, you will receive an invoice from [employer]. Please contact [facility] at [phone] with questions or concerns regarding your invoice.   Our billing staff will not be able to assist you with questions regarding bills from these companies.  You will be contacted with the lab results as soon as they are available. The fastest way to get your results is to activate your My Chart account. Instructions are located on the last page of this paperwork. If you have not heard from [place] regarding the results in 2 weeks, please contact this office.        I personally performed the services described in this documentation, which was scribed in my presence. The recorded information has been reviewed and considered,  and addended by me as needed.   Signed,   Bethany Ray, MD [facility].  07/10/16 2:20 PM

## 2016-07-10 NOTE — Patient Instructions (Addendum)
I will refer you to general surgeon for evaluation of the hernia.  See precautions below.   I will recheck the anemia as well as screen for diabetes with the PCOS.  Continue iron supplement at same dose for now.   Exercise 150 minutes per week as initial approach to PCOS. Diet as below.   Follow up with me in next month to review plan for PCOS. One option would be to change one of your blood pressure medicines to spirinolactone. We can discuss this at follow up. No change in meds for now.     Diet for Polycystic Ovarian Syndrome Introduction Polycystic ovary syndrome (PCOS) is a disorder of the chemical messengers (hormones) that regulate menstruation. The condition causes important hormones to be out of balance. PCOS can:  Make your periods irregular or stop.  Cause cysts to develop on the ovaries.  Make it difficult to get pregnant.  Stop your body from responding to the effects of insulin (insulin resistance), which can lead to obesity and diabetes. Changing what you eat can help manage PCOS and improve your health. It can help you lose weight and improve the way your body uses insulin. What is my plan?  Eat breakfast, lunch, and dinner plus two snacks every day.  Include protein in each meal and snack.  Choose whole grains instead of products made with refined flour.  Eat a variety of foods.  Exercise regularly as told by your health care provider. What do I need to know about this eating plan? If you are overweight or obese, pay attention to how many calories you eat. Cutting down on calories can help you lose weight. Work with your health care provider or dietitian to figure out how many calories you need each day. What foods can I eat? Grains  Whole grains, such as whole wheat. Whole-grain breads, crackers, cereals, and pasta. Unsweetened oatmeal, bulgur, barley, quinoa, or brown rice. Corn or whole-wheat flour tortillas. Vegetables  Lettuce. Spinach. Peas. Beets.  Cauliflower. Cabbage. Broccoli. Carrots. Tomatoes. Squash. Eggplant. Herbs. Peppers. Onions. Cucumbers. Brussels sprouts. Fruits  Berries. Bananas. Apples. Oranges. Grapes. Papaya. Mango. Pomegranate. Kiwi. Grapefruit. Cherries. Meats and Other Protein Sources  Lean proteins, such as fish, chicken, beans, eggs, and tofu. Dairy  Low-fat dairy products, such as skim milk, cheese sticks, and yogurt. Beverages  Low-fat or fat-free drinks, such as water, low-fat milk, sugar-free drinks, and 100% fruit juice. Condiments  Ketchup. Mustard. Barbecue sauce. Relish. Low-fat or fat-free mayonnaise. Fats and Oils  Olive oil or canola oil. Walnuts and almonds. The items listed above may not be a complete list of recommended foods or beverages. Contact your dietitian for more options.  What foods are not recommended? Foods high in calories or fat. Fried foods. Sweets. Products made from refined white flour, including white bread, pastries, white rice, and pasta. The items listed above may not be a complete list of foods and beverages to avoid. Contact your dietitian for more information.  This information is not intended to replace advice given to you by your health care provider. Make sure you discuss any questions you have with your health care provider. Document Released: 11/12/2015 Document Revised: 12/27/2015 Document Reviewed: 08/02/2014  2017 Elsevier    Polycystic Ovarian Syndrome Polycystic ovarian syndrome (PCOS) is a common hormonal disorder among women of reproductive age. In most women with PCOS, many small fluid-filled sacs (cysts) grow on the ovaries, and the cysts are not part of a normal menstrual cycle. PCOS can cause problems  with your menstrual periods and make it difficult to get pregnant. It can also cause an increased risk of miscarriage with pregnancy. If it is not treated, PCOS can lead to serious health problems, such as diabetes and heart disease. What are the causes? The  cause of PCOS is not known, but it may be the result of a combination of certain factors, such as:  Irregular menstrual cycle.  High levels of certain hormones (androgens).  Problems with the hormone that helps to control blood sugar (insulin resistance).  Certain genes. What increases the risk? This condition is more likely to develop in women who have a family history of PCOS. What are the signs or symptoms? Symptoms of PCOS may include:  Multiple ovarian cysts.  Infrequent periods or no periods.  Periods that are too frequent or too heavy.  Unpredictable periods.  Inability to get pregnant (infertility) because of not ovulating.  Increased growth of hair on the face, chest, stomach, back, thumbs, thighs, or toes.  Acne or oily skin. Acne may develop during adulthood, and it may not respond to treatment.  Pelvic pain.  Weight gain or obesity.  Patches of thickened and dark brown or black skin on the neck, arms, breasts, or thighs (acanthosis nigricans).  Excess hair growth on the face, chest, abdomen, or upper thighs (hirsutism). How is this diagnosed? This condition is diagnosed based on:  Your medical history.  A physical exam, including a pelvic exam. Your health care provider may look for areas of increased hair growth on your skin.  Tests, such as:  Ultrasound. This may be used to examine the ovaries and the lining of the uterus (endometrium) for cysts.  Blood tests. These may be used to check levels of sugar (glucose), female hormone (testosterone), and female hormones (estrogen and progesterone) in your blood. How is this treated? There is no cure for PCOS, but treatment can help to manage symptoms and prevent more health problems from developing. Treatment varies depending on:  Your symptoms.  Whether you want to have a baby or whether you need birth control (contraception). Treatment may include nutrition and lifestyle changes along with:  Progesterone  hormone to start a menstrual period.  Birth control pills to help you have regular menstrual periods.  Medicines to make you ovulate, if you want to get pregnant.  Medicine to reduce excessive hair growth.  Surgery, in severe cases. This may involve making small holes in one or both of your ovaries. This decreases the amount of testosterone that your body produces. Follow these instructions at home:  Take over-the-counter and prescription medicines only as told by your health care provider.  Follow a healthy meal plan. This can help you reduce the effects of PCOS.  Eat a healthy diet that includes lean proteins, complex carbohydrates, fresh fruits and vegetables, low-fat dairy products, and healthy fats. Make sure to eat enough fiber.  If you are overweight, lose weight as told by your health care provider.  Losing 10% of your body weight may improve symptoms.  Your health care provider can determine how much weight loss is best for you and can help you lose weight safely.  Keep all follow-up visits as told by your health care provider. This is important. Contact a health care provider if:  Your symptoms do not get better with medicine.  You develop new symptoms. This information is not intended to replace advice given to you by your health care provider. Make sure you discuss any questions you  have with your health care provider. Document Released: 11/14/2004 Document Revised: 03/18/2016 Document Reviewed: 01/06/2016 Elsevier Interactive Patient Education  2017 [place], Adult A hernia is the bulging of an organ or tissue through a weak spot in the muscles of the abdomen (abdominal wall). Hernias develop most often near the navel or groin. There are many kinds of hernias. Common kinds include:  Femoral hernia. This kind of hernia develops under the groin in the upper thigh area.  Inguinal hernia. This kind of hernia develops in the groin or  scrotum.  Umbilical hernia. This kind of hernia develops near the navel.  Hiatal hernia. This kind of hernia causes part of the stomach to be pushed up into the chest.  Incisional hernia. This kind of hernia bulges through a scar from an abdominal surgery. What are the causes? This condition may be caused by:  Heavy lifting.  Coughing over a long period of time.  Straining to have a bowel movement.  An incision made during an abdominal surgery.  A birth defect (congenital defect).  Excess weight or obesity.  Smoking.  Poor nutrition.  Cystic fibrosis.  Excess fluid in the abdomen.  Undescended testicles. What are the signs or symptoms? Symptoms of a hernia include:  A lump on the abdomen. This is the first sign of a hernia. The lump may become more obvious with standing, straining, or coughing. It may get bigger over time if it is not treated or if the condition causing it is not treated.  Pain. A hernia is usually painless, but it may become painful over time if treatment is delayed. The pain is usually dull and may get worse with standing or lifting heavy objects. Sometimes a hernia gets tightly squeezed in the weak spot (strangulated) or stuck there (incarcerated) and causes additional symptoms. These symptoms may include:  Vomiting.  Nausea.  Constipation.  Irritability. How is this diagnosed? A hernia may be diagnosed with:  A physical exam. During the exam your health care provider may ask you to cough or to make a specific movement, because a hernia is usually more visible when you move.  Imaging tests. These can include:  X-rays.  Ultrasound.  CT scan. How is this treated? A hernia that is small and painless may not need to be treated. A hernia that is large or painful may be treated with surgery. Inguinal hernias may be treated with surgery to prevent incarceration or strangulation. Strangulated hernias are always treated with surgery, because lack  of blood to the trapped organ or tissue can cause it to die. Surgery to treat a hernia involves pushing the bulge back into place and repairing the weak part of the abdomen. Follow these instructions at home:  Avoid straining.  Do not lift anything heavier than 10 lb (4.5 kg).  Lift with your leg muscles, not your back muscles. This helps avoid strain.  When coughing, try to cough gently.  Prevent constipation. Constipation leads to straining with bowel movements, which can make a hernia worse or cause a hernia repair to break down. You can prevent constipation by:  Eating a high-fiber diet that includes plenty of fruits and vegetables.  Drinking enough fluids to keep your urine clear or pale yellow. Aim to drink 6-8 glasses of water per day.  Using a stool softener as directed by your health care provider.  Lose weight, if you are overweight.  Do not use any tobacco products, including cigarettes, chewing tobacco,  or electronic cigarettes. If you need help quitting, ask your health care provider.  Keep all follow-up visits as directed by your health care provider. This is important. Your health care provider may need to monitor your condition. Contact a health care provider if:  You have swelling, redness, and pain in the affected area.  Your bowel habits change. Get help right away if:  You have a fever.  You have abdominal pain that is getting worse.  You feel nauseous or you vomit.  You cannot push the hernia back in place by gently pressing on it while you are lying down.  The hernia:  Changes in shape or size.  Is stuck outside the abdomen.  Becomes discolored.  Feels hard or tender. This information is not intended to replace advice given to you by your health care provider. Make sure you discuss any questions you have with your health care provider. Document Released: 07/21/2005 Document Revised: 12/19/2015 Document Reviewed: 05/31/2014 Elsevier Interactive  Patient Education  2017 [employer].       IF you received an x-ray today, you will receive an invoice from [facility]. Please contact [facility] at [phone] with questions or concerns regarding your invoice.   IF you received labwork today, you will receive an invoice from [employer]. Please contact [facility] at [phone] with questions or concerns regarding your invoice.   Our billing staff will not be able to assist you with questions regarding bills from these companies.  You will be contacted with the lab results as soon as they are available. The fastest way to get your results is to activate your My Chart account. Instructions are located on the last page of this paperwork. If you have not heard from [place] regarding the results in 2 weeks, please contact this office.

## 2016-07-11 LAB — CBC WITH DIFFERENTIAL/PLATELET
BASOS: 0 %
Basophils Absolute: 0 10*3/uL (ref 0.0–0.2)
EOS (ABSOLUTE): 0.1 10*3/uL (ref 0.0–0.4)
EOS: 1 %
HEMATOCRIT: 40.8 % (ref 34.0–46.6)
HEMOGLOBIN: 14.2 g/dL (ref 11.1–15.9)
IMMATURE GRANS (ABS): 0 10*3/uL (ref 0.0–0.1)
Immature Granulocytes: 0 %
LYMPHS: 13 %
Lymphocytes Absolute: 1.4 10*3/uL (ref 0.7–3.1)
MCH: 34.3 pg — AB (ref 26.6–33.0)
MCHC: 34.8 g/dL (ref 31.5–35.7)
MCV: 99 fL — AB (ref 79–97)
MONOCYTES: 8 %
Monocytes Absolute: 0.8 10*3/uL (ref 0.1–0.9)
NEUTROS ABS: 8 10*3/uL — AB (ref 1.4–7.0)
Neutrophils: 78 %
Platelets: 396 10*3/uL — ABNORMAL HIGH (ref 150–379)
RBC: 4.14 x10E6/uL (ref 3.77–5.28)
RDW: 14.3 % (ref 12.3–15.4)
WBC: 10.3 10*3/uL (ref 3.4–10.8)

## 2016-07-11 LAB — HEMOGLOBIN A1C
Est. average glucose Bld gHb Est-mCnc: 100 mg/dL
HEMOGLOBIN A1C: 5.1 % (ref 4.8–5.6)

## 2016-08-14 ENCOUNTER — Ambulatory Visit: Payer: BLUE CROSS/BLUE SHIELD | Admitting: Family Medicine

## 2016-11-04 IMAGING — MR MR HEAD W/O CM
11 series · 42 of 48 positions shown · non-contrast
Comparison: None.

CLINICAL DATA: Posterior left-sided headache associated with sexual
activity for a few months. Same symptoms a few years ago, with
recent recurrence.

EXAM:
MRI HEAD WITHOUT CONTRAST
MRA HEAD WITHOUT CONTRAST
TECHNIQUE: Multiplanar, multiecho pulse sequences of the brain and surrounding
structures were obtained without intravenous contrast. Angiographic
images of the head were obtained using MRA technique without
contrast.

[Series 2: t1_se_sag · sagittal · 5.0mm · 0.45mm/px · 1 of 21 slices shown]
[im 1/21]
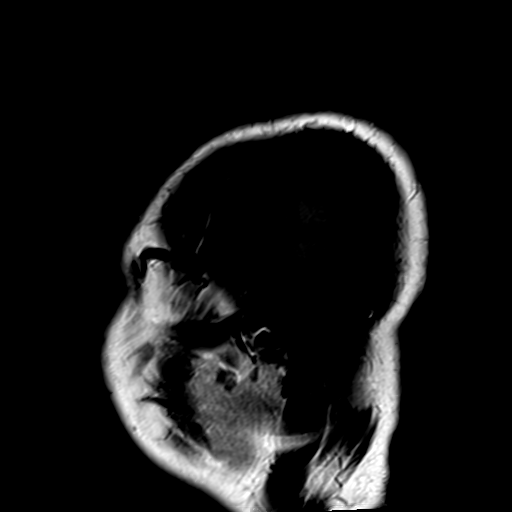

[Series 3: ep2d_diff_(id)_trace · axial · 3.0mm · 1.80mm/px · z∈[-33,+113]mm · 8 of 99 slices shown]
[im 1/99]
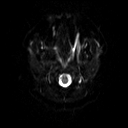
[im 15/99]
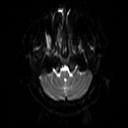
[im 29/99]
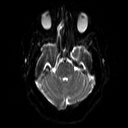
[im 43/99]
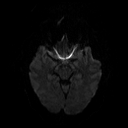
[im 57/99]
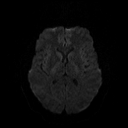
[im 71/99]
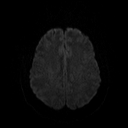
[im 85/99]
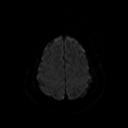
[im 99/99]
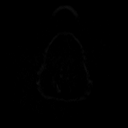

[Series 4: ep2d_diff_(id)_trace_adc · axial · 3.0mm · 1.80mm/px · z∈[-33,+113]mm · 4 of 50 slices shown]
[im 1/50]
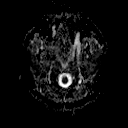
[im 17/50]
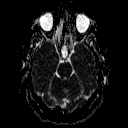
[im 33/50]
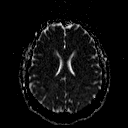
[im 50/50]
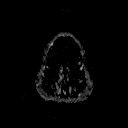

[Series 5: ep2d_diff_cor · coronal · 5.0mm · 1.77mm/px · 4 of 48 slices shown]
[im 1/48]
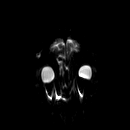
[im 16/48]
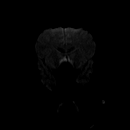
[im 32/48]
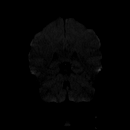
[im 48/48]
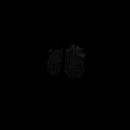

[Series 6: ep2d_diff_cor_adc · coronal · 5.0mm · 1.77mm/px · 2 of 24 slices shown]
[im 1/24]
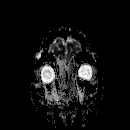
[im 24/24]
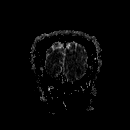

[Series 9: swi_images · axial · 2.0mm · 0.90mm/px · z∈[-38,+119]mm · 6 of 80 slices shown]
[im 1/80]
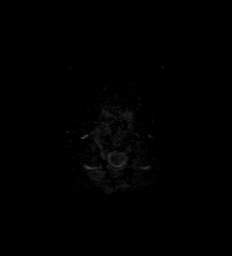
[im 16/80]
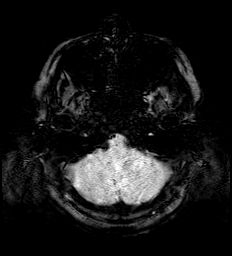
[im 32/80]
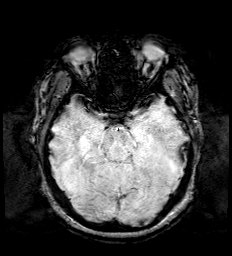
[im 48/80]
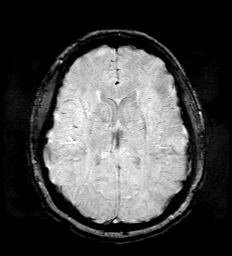
[im 64/80]
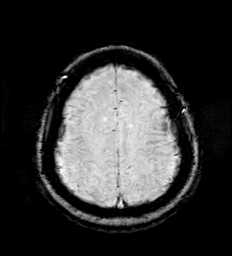
[im 80/80]
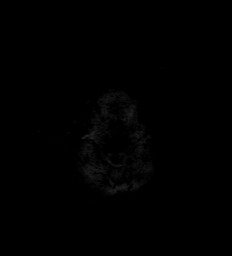

[Series 10: tof_3d_multi-slab new · axial · 0.7mm · 0.35mm/px · z∈[-19,+78]mm · 8 of 143 slices shown]
[im 1/143]
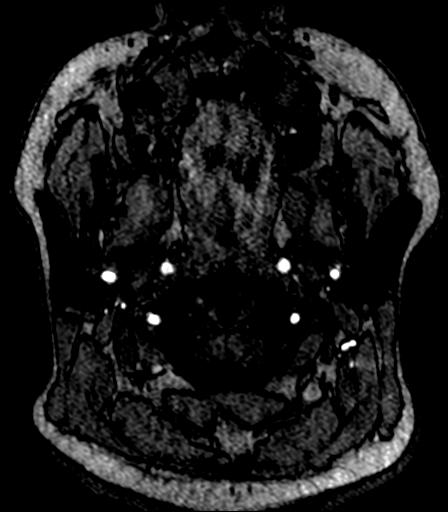
[im 29/143]
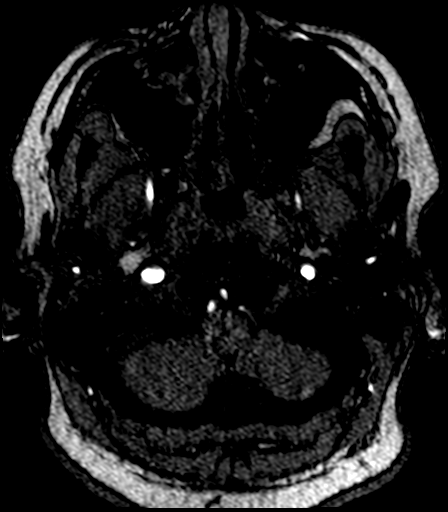
[im 43/143]
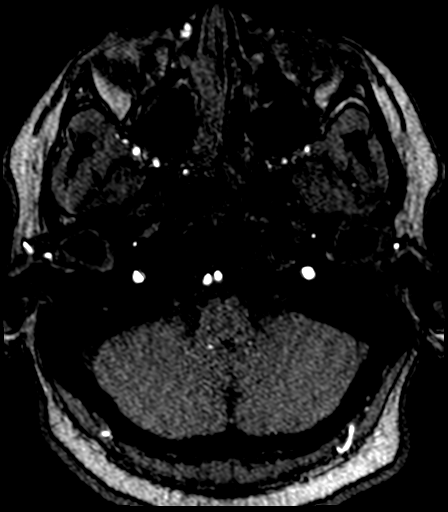
[im 57/143]
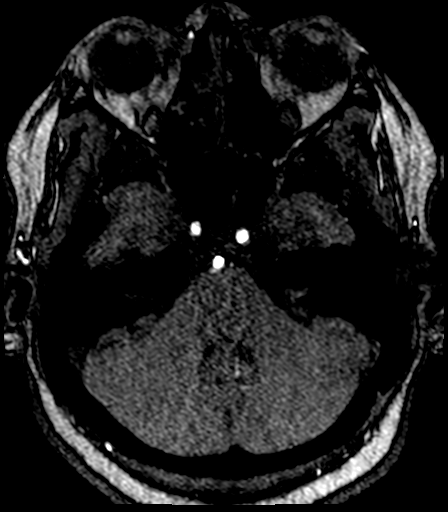
[im 86/143]
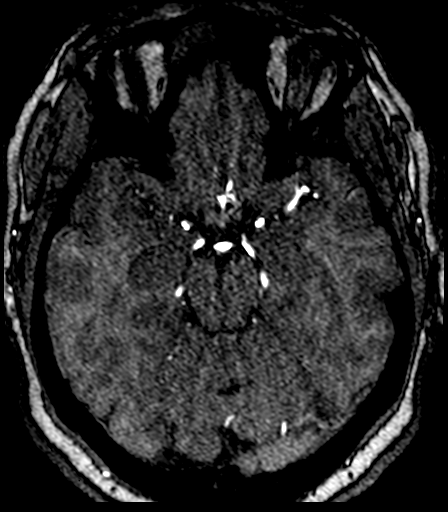
[im 100/143]
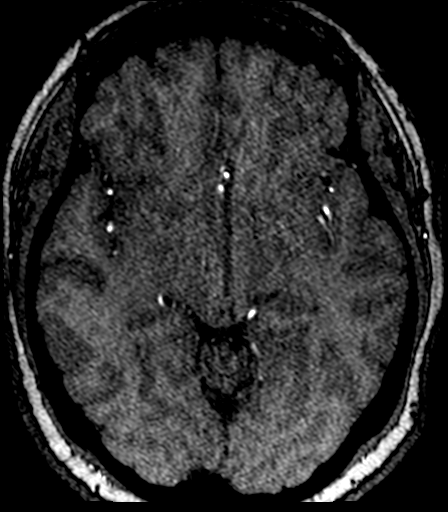
[im 114/143]
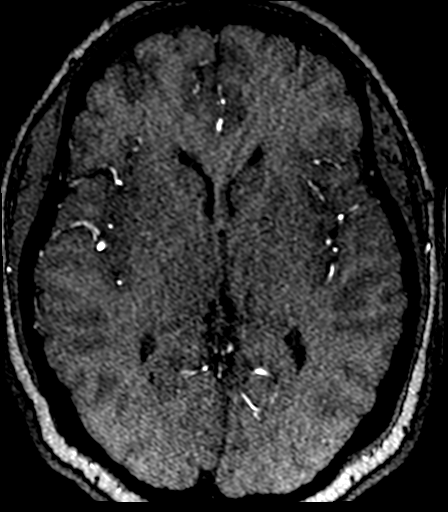
[im 143/143]
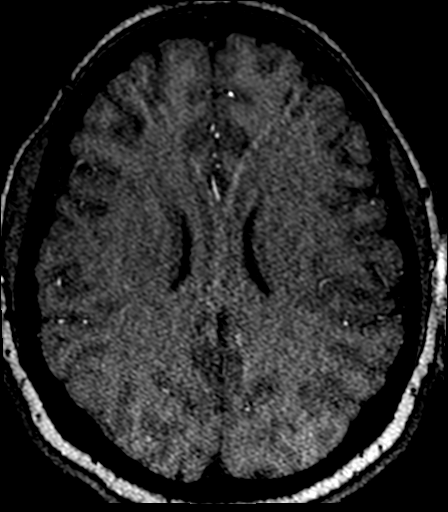

[Series 14: FLAIR · axial · 5.0mm · 0.45mm/px · z∈[-31,+112]mm · 2 of 24 slices shown]
[im 1/24]
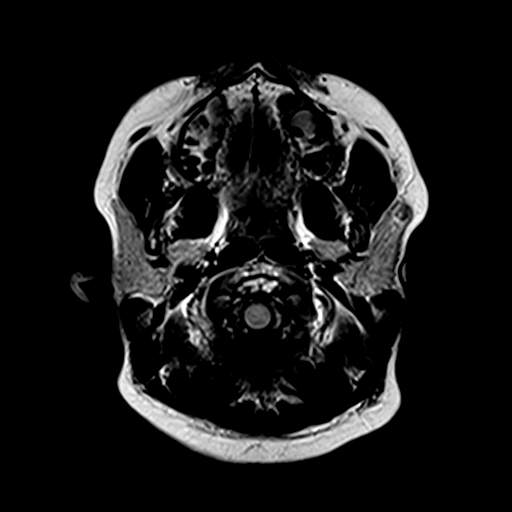
[im 24/24]
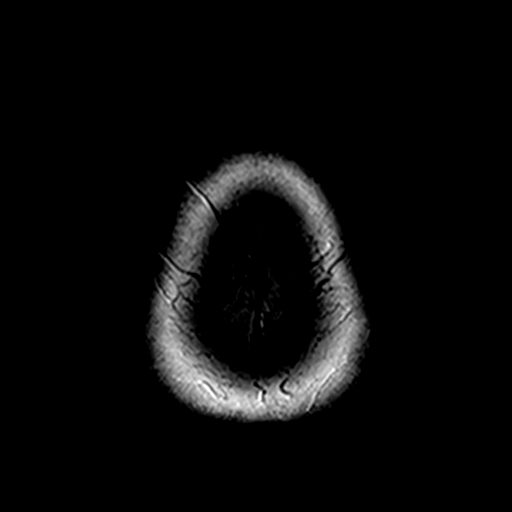

[Series 15: t2_tse_tra_512 · axial · 5.0mm · 0.60mm/px · z∈[-31,+113]mm · 2 of 24 slices shown]
[im 1/24]
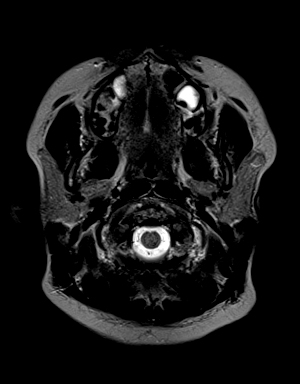
[im 24/24]
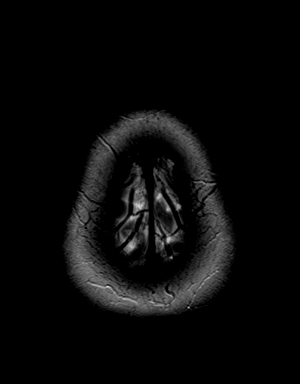

[Series 18: t1_mpr_tra · axial · 2.0mm · 0.45mm/px · z∈[-38,+24]mm · 3 of 80 slices shown]
[im 1/80]
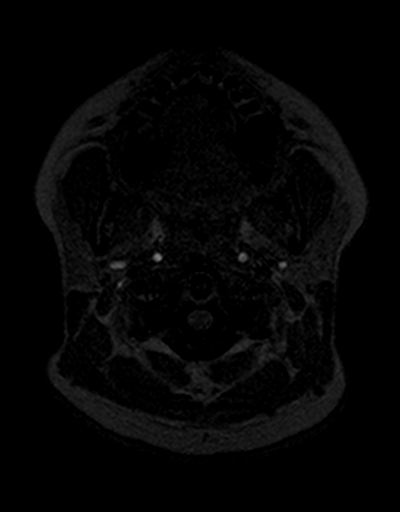
[im 16/80]
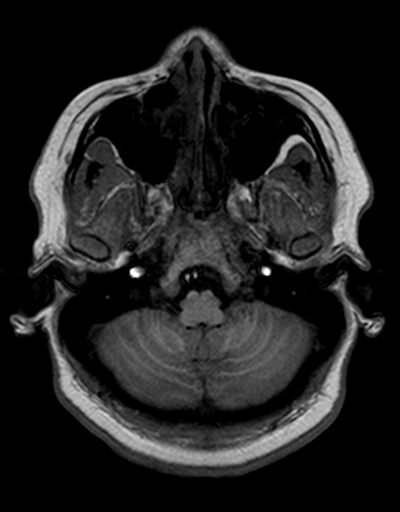
[im 32/80]
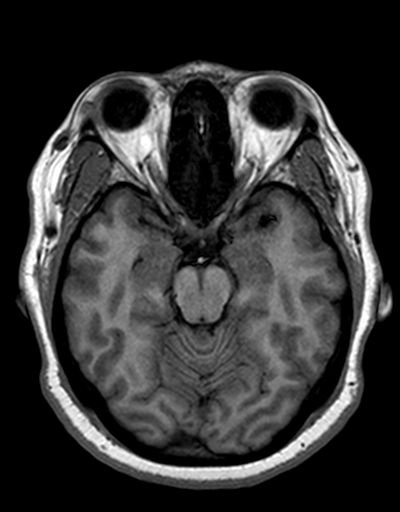

[Series 21: T2 · coronal · 5.0mm · 0.45mm/px · 2 of 25 slices shown]
[im 1/25]
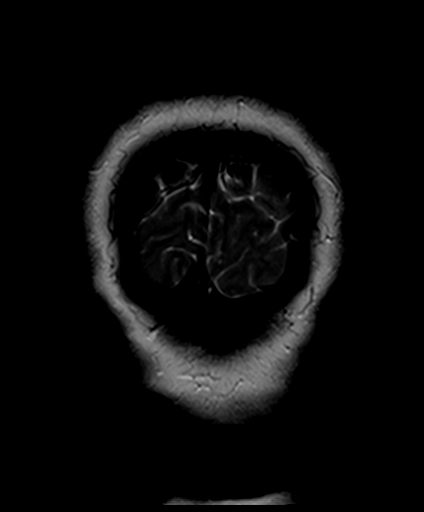
[im 25/25]
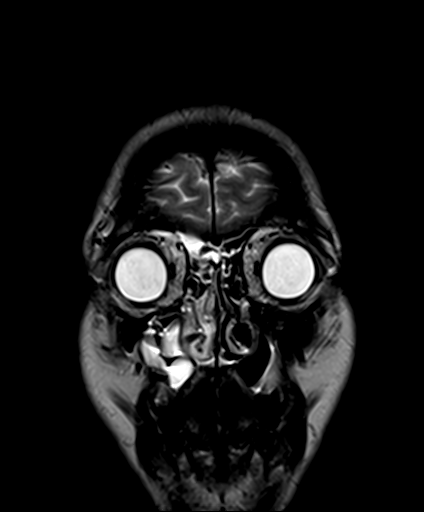

[42 of 48 positions shown; findings below may reference images not displayed]

FINDINGS: MRI HEAD FINDINGS

There is a mildly expanded partially empty sella. The cerebellar
tonsils are normally positioned. There is no evidence of acute
infarct, intracranial hemorrhage, mass, midline shift, or
extra-axial fluid collection. Ventricles and sulci are normal. There
are greater than 20 small foci of T2 hyperintensity in the cerebral
white matter, predominantly subcortical in location and greatest in
the frontal lobes.

Orbits are unremarkable. Mild-to-moderate paranasal sinus mucosal
thickening is present with greatest involvement of the right
frontal, right ethmoid, and right maxillary sinuses. No significant
mastoid fluid. Major intracranial vascular flow voids are preserved.

MRA HEAD FINDINGS

The study is mildly motion degraded.

The visualized distal vertebral artery is are patent and codominant.
PICA AICA, and SCA origins are patent. Basilar artery is widely
patent with a fenestration proximally. There are small left and
possibly small right posterior communicating arteries. PCAs are
patent without evidence of major branch occlusion or significant
proximal stenosis.

Internal carotid arteries are patent from skullbase to carotid
termini without stenosis. ACAs and MCAs are patent without evidence
of major branch occlusion or significant proximal stenosis. No
intracranial aneurysm is identified.
IMPRESSION: 1. No acute intracranial abnormality.
2. Mild cerebral white matter T2 signal changes, abnormal for age
and nonspecific. Considerations include chronic small vessel
ischemia, sequelae of trauma, hypercoagulable state, vasculitis,
migraines, prior infection and demyelination.
3. Partially empty sella, typically an incidental finding though can
be seen in the setting of intracranial hypertension.
4. Negative head MRA.

## 2016-11-07 ENCOUNTER — Other Ambulatory Visit (HOSPITAL_COMMUNITY): Payer: Self-pay | Admitting: Obstetrics and Gynecology

## 2016-11-07 DIAGNOSIS — Z975 Presence of (intrauterine) contraceptive device: Secondary | ICD-10-CM

## 2016-11-13 ENCOUNTER — Ambulatory Visit (HOSPITAL_COMMUNITY)
Admission: RE | Admit: 2016-11-13 | Discharge: 2016-11-13 | Disposition: A | Discharge status: Home / Self Care | Payer: BLUE CROSS/BLUE SHIELD | Source: Ambulatory Visit | Attending: Obstetrics and Gynecology | Admitting: Obstetrics and Gynecology

## 2016-11-13 DIAGNOSIS — Z975 Presence of (intrauterine) contraceptive device: Secondary | ICD-10-CM | POA: Insufficient documentation

## 2016-11-16 ENCOUNTER — Other Ambulatory Visit: Payer: Self-pay | Admitting: Urgent Care

## 2016-11-16 DIAGNOSIS — I10 Essential (primary) hypertension: Secondary | ICD-10-CM

## 2016-11-25 ENCOUNTER — Ambulatory Visit (INDEPENDENT_AMBULATORY_CARE_PROVIDER_SITE_OTHER): Payer: BLUE CROSS/BLUE SHIELD

## 2016-11-25 ENCOUNTER — Ambulatory Visit (INDEPENDENT_AMBULATORY_CARE_PROVIDER_SITE_OTHER): Payer: BLUE CROSS/BLUE SHIELD | Admitting: Family Medicine

## 2016-11-25 ENCOUNTER — Encounter: Payer: Self-pay | Admitting: Family Medicine

## 2016-11-25 VITALS — BP 130/86 | HR 81 | Temp 98.0°F | Resp 18 | Ht 65.95 in | Wt 237.4 lb

## 2016-11-25 DIAGNOSIS — M5442 Lumbago with sciatica, left side: Secondary | ICD-10-CM

## 2016-11-25 NOTE — Patient Instructions (Addendum)
See info on low back pain and sciatica.  As you're symptoms are intermittent, would initially try exercises as listed below, range of motion, and avoid specific exercises that worsen that back pain. I would avoid crunches and sit ups for now as this can increase pain across the low back. Occasional Advil or Aleve is okay if needed short-term. Recheck in the next 2-4 weeks, sooner if worsening.  Back Pain, Adult Back pain is very common in adults.The cause of back pain is rarely dangerous and the pain often gets better over time.The cause of your back pain may not be known. Some common causes of back pain include:  Strain of the muscles or ligaments supporting the spine.  Wear and tear (degeneration) of the spinal disks.  Arthritis.  Direct injury to the back. For many people, back pain may return. Since back pain is rarely dangerous, most people can learn to manage this condition on their own. Follow these instructions at home: Watch your back pain for any changes. The following actions may help to lessen any discomfort you are feeling:  Remain active. It is stressful on your back to sit or stand in one place for long periods of time. Do not sit, drive, or stand in one place for more than 30 minutes at a time. Take short walks on even surfaces as soon as you are able.Try to increase the length of time you walk each day.  Exercise regularly as directed by your health care provider. Exercise helps your back heal faster. It also helps avoid future injury by keeping your muscles strong and flexible.  Do not stay in bed.Resting more than 1-2 days can delay your recovery.  Pay attention to your body when you bend and lift. The most comfortable positions are those that put less stress on your recovering back. Always use proper lifting techniques, including:  Bending your knees.  Keeping the load close to your body.  Avoiding twisting.  Find a comfortable position to sleep. Use a firm  mattress and lie on your side with your knees slightly bent. If you lie on your back, put a pillow under your knees.  Avoid feeling anxious or stressed.Stress increases muscle tension and can worsen back pain.It is important to recognize when you are anxious or stressed and learn ways to manage it, such as with exercise.  Take medicines only as directed by your health care provider. Over-the-counter medicines to reduce pain and inflammation are often the most helpful.Your health care provider may prescribe muscle relaxant drugs.These medicines help dull your pain so you can more quickly return to your normal activities and healthy exercise.  Apply ice to the injured area:  Put ice in a plastic bag.  Place a towel between your skin and the bag.  Leave the ice on for 20 minutes, 2-3 times a day for the first 2-3 days. After that, ice and heat may be alternated to reduce pain and spasms.  Maintain a healthy weight. Excess weight puts extra stress on your back and makes it difficult to maintain good posture. Contact a health care provider if:  You have pain that is not relieved with rest or medicine.  You have increasing pain going down into the legs or buttocks.  You have pain that does not improve in one week.  You have night pain.  You lose weight.  You have a fever or chills. Get help right away if:  You develop new bowel or bladder control problems.  You  have unusual weakness or numbness in your arms or legs.  You develop nausea or vomiting.  You develop abdominal pain.  You feel faint. This information is not intended to replace advice given to you by your health care provider. Make sure you discuss any questions you have with your health care provider. Document Released: 07/21/2005 Document Revised: 11/29/2015 Document Reviewed: 11/22/2013 Elsevier Interactive Patient Education  2017 [employer]  Sciatica Sciatica is pain, numbness, weakness, or tingling along  the path of the sciatic nerve. The sciatic nerve starts in the lower back and runs down the back of each leg. The nerve controls the muscles in the lower leg and in the back of the knee. It also provides feeling (sensation) to the back of the thigh, the lower leg, and the sole of the foot. Sciatica is a symptom of another medical condition that pinches or puts pressure on the sciatic nerve. Generally, sciatica only affects one side of the body. Sciatica usually goes away on its own or with treatment. In some cases, sciatica may keep coming back (recur). What are the causes? This condition is caused by pressure on the sciatic nerve, or pinching of the sciatic nerve. This may be the result of:  A disk in between the bones of the spine (vertebrae) bulging out too far (herniated disk).  Age-related changes in the spinal disks (degenerative disk disease).  A pain disorder that affects a muscle in the buttock (piriformis syndrome).  Extra bone growth (bone spur) near the sciatic nerve.  An injury or break (fracture) of the pelvis.  Pregnancy.  Tumor (rare). What increases the risk? The following factors may make you more likely to develop this condition:  Playing sports that place pressure or stress on the spine, such as football or weight lifting.  Having poor strength and flexibility.  A history of back injury.  A history of back surgery.  Sitting for long periods of time.  Doing activities that involve repetitive bending or lifting.  Obesity. What are the signs or symptoms? Symptoms can vary from mild to very severe, and they may include:  Any of these problems in the lower back, leg, hip, or buttock:  Mild tingling or dull aches.  Burning sensations.  Sharp pains.  Numbness in the back of the calf or the sole of the foot.  Leg weakness.  Severe back pain that makes movement difficult. These symptoms may get worse when you cough, sneeze, or laugh, or when you sit or  stand for long periods of time. Being overweight may also make symptoms worse. In some cases, symptoms may recur over time. How is this diagnosed? This condition may be diagnosed based on:  Your symptoms.  A physical exam. Your health care provider may ask you to do certain movements to check whether those movements trigger your symptoms.  You may have tests, including:  Blood tests.  X-rays.  MRI.  CT scan. How is this treated? In many cases, this condition improves on its own, without any treatment. However, treatment may include:  Reducing or modifying physical activity during periods of pain.  Exercising and stretching to strengthen your abdomen and improve the flexibility of your spine.  Icing and applying heat to the affected area.  Medicines that help:  To relieve pain and swelling.  To relax your muscles.  Injections of medicines that help to relieve pain, irritation, and inflammation around the sciatic nerve (steroids).  Surgery. Follow these instructions at home: Medicines   Take  over-the-counter and prescription medicines only as told by your health care provider.  Do not drive or operate heavy machinery while taking prescription pain medicine. Managing pain   If directed, apply ice to the affected area.  Put ice in a plastic bag.  Place a towel between your skin and the bag.  Leave the ice on for 20 minutes, 2-3 times a day.  After icing, apply heat to the affected area before you exercise or as often as told by your health care provider. Use the heat source that your health care provider recommends, such as a moist heat pack or a heating pad.  Place a towel between your skin and the heat source.  Leave the heat on for 20-30 minutes.  Remove the heat if your skin turns bright red. This is especially important if you are unable to feel pain, heat, or cold. You may have a greater risk of getting burned. Activity   Return to your normal activities  as told by your health care provider. Ask your health care provider what activities are safe for you.  Avoid activities that make your symptoms worse.  Take brief periods of rest throughout the day. Resting in a lying or standing position is usually better than sitting to rest.  When you rest for longer periods, mix in some mild activity or stretching between periods of rest. This will help to prevent stiffness and pain.  Avoid sitting for long periods of time without moving. Get up and move around at least one time each hour.  Exercise and stretch regularly, as told by your health care provider.  Do not lift anything that is heavier than 10 lb (4.5 kg) while you have symptoms of sciatica. When you do not have symptoms, you should still avoid heavy lifting, especially repetitive heavy lifting.  When you lift objects, always use proper lifting technique, which includes:  Bending your knees.  Keeping the load close to your body.  Avoiding twisting. General instructions   Use good posture.  Avoid leaning forward while sitting.  Avoid hunching over while standing.  Maintain a healthy weight. Excess weight puts extra stress on your back and makes it difficult to maintain good posture.  Wear supportive, comfortable shoes. Avoid wearing high heels.  Avoid sleeping on a mattress that is too soft or too hard. A mattress that is firm enough to support your back when you sleep may help to reduce your pain.  Keep all follow-up visits as told by your health care provider. This is important. Contact a health care provider if:  You have pain that wakes you up when you are sleeping.  You have pain that gets worse when you lie down.  Your pain is worse than you have experienced in the past.  Your pain lasts longer than 4 weeks.  You experience unexplained weight loss. Get help right away if:  You lose control of your bowel or bladder (incontinence).  You have:  Weakness in your  lower back, pelvis, buttocks, or legs that gets worse.  Redness or swelling of your back.  A burning sensation when you urinate. This information is not intended to replace advice given to you by your health care provider. Make sure you discuss any questions you have with your health care provider. Document Released: 07/15/2001 Document Revised: 12/25/2015 Document Reviewed: 03/30/2015 Elsevier Interactive Patient Education  2017 [place].   Sciatica Rehab Ask your health care provider which exercises are safe for you. Do exercises exactly  as told by your health care provider and adjust them as directed. It is normal to feel mild stretching, pulling, tightness, or discomfort as you do these exercises, but you should stop right away if you feel sudden pain or your pain gets worse.Do not begin these exercises until told by your health care provider. Stretching and range of motion exercises These exercises warm up your muscles and joints and improve the movement and flexibility of your hips and your back. These exercises also help to relieve pain, numbness, and tingling. Exercise A: Sciatic nerve glide  1. Sit in a chair with your head facing down toward your chest. Place your hands behind your back. Let your shoulders slump forward. 2. Slowly straighten one of your knees while you tilt your head back as if you are looking toward the ceiling. Only straighten your leg as far as you can without making your symptoms worse. 3. Hold for __________ seconds. 4. Slowly return to the starting position. 5. Repeat with your other leg. Repeat __________ times. Complete this exercise __________ times a day. Exercise B: Knee to chest with hip adduction and internal rotation   1. Lie on your back on a firm surface with both legs straight. 2. Bend one of your knees and move it up toward your chest until you feel a gentle stretch in your lower back and buttock. Then, move your knee toward the shoulder that  is on the opposite side from your leg.  Hold your leg in this position by holding onto the front of your knee. 3. Hold for __________ seconds. 4. Slowly return to the starting position. 5. Repeat with your other leg. Repeat __________ times. Complete this exercise __________ times a day. Exercise C: Prone extension on elbows   1. Lie on your abdomen on a firm surface. A bed may be too soft for this exercise. 2. Prop yourself up on your elbows. 3. Use your arms to help lift your chest up until you feel a gentle stretch in your abdomen and your lower back.  This will place some of your body weight on your elbows. If this is uncomfortable, try stacking pillows under your chest.  Your hips should stay down, against the surface that you are lying on. Keep your hip and back muscles relaxed. 4. Hold for __________ seconds. 5. Slowly relax your upper body and return to the starting position. Repeat __________ times. Complete this exercise __________ times a day. Strengthening exercises These exercises build strength and endurance in your back. Endurance is the ability to use your muscles for a long time, even after they get tired. Exercise D: Pelvic tilt  1. Lie on your back on a firm surface. Bend your knees and keep your feet flat. 2. Tense your abdominal muscles. Tip your pelvis up toward the ceiling and flatten your lower back into the floor.  To help with this exercise, you may place a small towel under your lower back and try to push your back into the towel. 3. Hold for __________ seconds. 4. Let your muscles relax completely before you repeat this exercise. Repeat __________ times. Complete this exercise __________ times a day. Exercise E: Alternating arm and leg raises   1. Get on your hands and knees on a firm surface. If you are on a hard floor, you may want to use padding to cushion your knees, such as an exercise mat. 2. Line up your arms and legs. Your hands should be below your  shoulders, and  your knees should be below your hips. 3. Lift your left leg behind you. At the same time, raise your right arm and straighten it in front of you.  Do not lift your leg higher than your hip.  Do not lift your arm higher than your shoulder.  Keep your abdominal and back muscles tight.  Keep your hips facing the ground.  Do not arch your back.  Keep your balance carefully, and do not hold your breath. 4. Hold for __________ seconds. 5. Slowly return to the starting position and repeat with your right leg and your left arm. Repeat __________ times. Complete this exercise __________ times a day. Posture and body mechanics   Body mechanics refers to the movements and positions of your body while you do your daily activities. Posture is part of body mechanics. Good posture and healthy body mechanics can help to relieve stress in your body's tissues and joints. Good posture means that your spine is in its natural S-curve position (your spine is neutral), your shoulders are pulled back slightly, and your head is not tipped forward. The following are general guidelines for applying improved posture and body mechanics to your everyday activities. Standing    When standing, keep your spine neutral and your feet about hip-width apart. Keep a slight bend in your knees. Your ears, shoulders, and hips should line up.  When you do a task in which you stand in one place for a long time, place one foot up on a stable object that is 2-4 inches (5-10 cm) high, such as a footstool. This helps keep your spine neutral. Sitting    When sitting, keep your spine neutral and keep your feet flat on the floor. Use a footrest, if necessary, and keep your thighs parallel to the floor. Avoid rounding your shoulders, and avoid tilting your head forward.  When working at a desk or a computer, keep your desk at a height where your hands are slightly lower than your elbows. Slide your chair under your desk  so you are close enough to maintain good posture.  When working at a computer, place your monitor at a height where you are looking straight ahead and you do not have to tilt your head forward or downward to look at the screen. Resting    When lying down and resting, avoid positions that are most painful for you.  If you have pain with activities such as sitting, bending, stooping, or squatting (flexion-based activities), lie in a position in which your body does not bend very much. For example, avoid curling up on your side with your arms and knees near your chest (fetal position).  If you have pain with activities such as standing for a long time or reaching with your arms (extension-based activities), lie with your spine in a neutral position and bend your knees slightly. Try the following positions:  Lying on your side with a pillow between your knees.  Lying on your back with a pillow under your knees. Lifting    When lifting objects, keep your feet at least shoulder-width apart and tighten your abdominal muscles.  Bend your knees and hips and keep your spine neutral. It is important to lift using the strength of your legs, not your back. Do not lock your knees straight out.  Always ask for help to lift heavy or awkward objects. This information is not intended to replace advice given to you by your health care provider. Make sure you discuss any  questions you have with your health care provider. Document Released: 07/21/2005 Document Revised: 03/27/2016 Document Reviewed: 04/06/2015 Elsevier Interactive Patient Education  2017 [employer].    IF you received an x-ray today, you will receive an invoice from [facility]. Please contact [facility] at [phone] with questions or concerns regarding your invoice.   IF you received labwork today, you will receive an invoice from [facility]. Please contact [facility] at [phone] with questions or concerns  regarding your invoice.   Our billing staff will not be able to assist you with questions regarding bills from these companies.  You will be contacted with the lab results as soon as they are available. The fastest way to get your results is to activate your My Chart account. Instructions are located on the last page of this paperwork. If you have not heard from [place] regarding the results in 2 weeks, please contact this office.

## 2016-11-25 NOTE — Progress Notes (Addendum)
Subjective:  This chart was scribed for Merri Ray MD, by Tamsen Roers, at [facility].  This patient was seen in room 8 and the patient's care was started at 12:53 PM.   Chief Complaint  Patient presents with  . Back Pain    X 3 weeks- lower back     Patient ID: Bethany [name], female    DOB: 07/17/1976, 41 y.o.   MRN: [id number]  HPI HPI Comments: Bethany [name] is a 41 y.o. female who presents to the [facility] complaining of lower back pain onset three weeks ago.   About three to four weeks ago, patient started lifting weights/ doing more squats/gluteus work outs. Afterwards, she felt an left sided odd discomfort and the next morning, she felt an increased soreness.  She is unable to exactly pin point the pain but states that its more in the left lower back/gluteal area. Patient has noticed her back pain more when she is having bowel movements and at times, after the bowel movements. She also noticed some pain shooting down her left leg intermittently afterwards especially while doing things like taking her son out of the car seat or picking up heavier objects.   She held off on the exercising and states that it has gotten better, but she is still afraid to begin exercising again.  She has a job where she had to remain seated in the past and intermittently had similar back pain.  She requested a standing desk which she has received.  She has started weight watchers. She has been taking ibuprofen and tylenol to alleviate the symptoms.    About a week ago, she also had pain in her upper left back as well.  Denies any blood in her urine or difficulty with urination.  Patient denies any history of kidney stones.  Denies chest pains or SOB.  Patient is currently menstruating.       Patient Active Problem List   Diagnosis Date Noted  . Anemia 07/10/2016  . Placental abruption 02/01/2015  . Severe preeclampsia 02/01/2015  . H/O cesarean  section 01/29/2015  . Uterine fibroid   . Hypertension 06/23/2013  . PCOS (polycystic ovarian syndrome) 06/23/2013  . Palpitations 06/23/2013   Past Medical History:  Diagnosis Date  . Anemia   . Chronic hypertension during pregnancy, antepartum 02/01/2015  . GERD (gastroesophageal reflux disease)   . Hypertension   . Uterine hemorrhage 02/01/2015   Past Surgical History:  Procedure Laterality Date  . CESAREAN SECTION N/A 01/29/2015   Procedure: CESAREAN SECTION;  Surgeon: Newton Pigg, MD;  Location: [facility];  Service: Obstetrics;  Laterality: N/A;  . HERNIA REPAIR     No Known Allergies Prior to Admission medications   Medication Sig Start Date End Date Taking? Authorizing Provider  amLODipine (NORVASC) 5 MG tablet Take 5 mg by mouth daily.   Yes Historical Provider, MD  Ferrous Sulfate (IRON SUPPLEMENT PO) Take by mouth continuous as needed.   Yes Historical Provider, MD  losartan-hydrochlorothiazide (HYZAAR) 100-25 MG tablet TAKE 1 TABLET BY MOUTH DAILY. 11/18/16  Yes Jaynee Eagles, PA-C  Ferrous Gluconate-C-Folic Acid (IRON-C PO) Take 65 mg by mouth.    Historical Provider, MD   Social History   Social History  . Marital status: Married    Spouse name: N/A  . Number of children: N/A  . Years of education: N/A   Occupational History  . Not on file.   Social History  Main Topics  . Smoking status: Never Smoker  . Smokeless tobacco: Never Used  . Alcohol use No  . Drug use: No  . Sexual activity: Yes    Birth control/ protection: None   Other Topics Concern  . Not on file   Social History Narrative  . No narrative on file      Review of Systems  Constitutional: Negative for chills and fever.  Respiratory: Negative for shortness of breath.   Cardiovascular: Negative for chest pain.  Genitourinary: Negative for dysuria and frequency.  Musculoskeletal: Positive for back pain.       Objective:   Physical Exam  Constitutional: She appears well-developed and  well-nourished. No distress.  HENT:  Head: Normocephalic and atraumatic.  Cardiovascular: Normal rate, regular rhythm and normal heart sounds.  Exam reveals no gallop and no friction rub.   No murmur heard. Abdominal:  No CVA tenderness.   Musculoskeletal:  No midline bony tenderness on thoracic or lumbar spine. Skin is intact, no ecchymosis, no rash. Paraspinal lumbar spine no appreciable spasm, non tender.   Sciatic notch is non tender currently but does locate that to be the area of pain previously. Flexion is normal. Full ROM pain free.   Negative straight leg raise while seated and pain free ROM of the hip  Neurological: She displays no Babinski's sign on the right side. She displays no Babinski's sign on the left side.  Reflex Scores:      Patellar reflexes are 2+ on the right side and 2+ on the left side.      Achilles reflexes are 2+ on the right side and 2+ on the left side. able to heel to toe walk without difficulty.   Psychiatric: She has a normal mood and affect. Her behavior is normal.   Vitals:   11/25/16 1229 11/25/16 1250  BP: (!) 157/92 130/86  Pulse: 81   Resp: 18   Temp: 98 F (36.7 C)   TempSrc: Oral   SpO2: 98%   Weight: 237 lb 6.4 oz (107.7 kg)   Height: 5' 5.95" (1.675 m)   Dg Lumbar Spine Complete  Result Date: 11/25/2016 CLINICAL DATA:  Low back pain intermittently over the last month. EXAM: LUMBAR SPINE - COMPLETE 4+ VIEW COMPARISON:  None. FINDINGS: For non rib-bearing lumbar type vertebra. Mild curvature convex to the left. No disc space narrowing. Mild lower lumbar facet osteoarthritis. No pars defect or slippage. Sacroiliac joints appear normal. 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 Mild curvature convex to the left. Mild lower lumbar facet osteoarthritis.  No disc space narrowing. Electronically Signed   By: Nelson Chimes M.D.   On: 11/25/2016 13:37       Assessment & Plan:   Bethany [name] is a 41 y.o.  female Left-sided low back pain with left-sided sciatica, unspecified chronicity - Plan: DG Lumbar Spine Complete Intermittent low back pain previously, now with increased symptoms past 1 month. Still episodic, but associated with sciatica left leg. Asymptomatic at present. May be in part due to new exercise regimen. For now, recommended home exercise program, specifically with exercises for sciatica, other handout given for low back pain, occasional Advil or Aleve short-term if needed, and recheck in the next 2-4 weeks. Sooner if worse.   Meds ordered this encounter  Medications  . Ferrous Sulfate (IRON SUPPLEMENT PO)    Sig: Take by mouth continuous as needed.   Patient Instructions    See info on low  back pain and sciatica.  As you're symptoms are intermittent, would initially try exercises as listed below, range of motion, and avoid specific exercises that worsen that back pain. I would avoid crunches and sit ups for now as this can increase pain across the low back. Occasional Advil or Aleve is okay if needed short-term. Recheck in the next 2-4 weeks, sooner if worsening.  Back Pain, Adult Back pain is very common in adults.The cause of back pain is rarely dangerous and the pain often gets better over time.The cause of your back pain may not be known. Some common causes of back pain include:  Strain of the muscles or ligaments supporting the spine.  Wear and tear (degeneration) of the spinal disks.  Arthritis.  Direct injury to the back. For many people, back pain may return. Since back pain is rarely dangerous, most people can learn to manage this condition on their own. Follow these instructions at home: Watch your back pain for any changes. The following actions may help to lessen any discomfort you are feeling:  Remain active. It is stressful on your back to sit or stand in one place for long periods of time. Do not sit, drive, or stand in one place for more than 30 minutes at  a time. Take short walks on even surfaces as soon as you are able.Try to increase the length of time you walk each day.  Exercise regularly as directed by your health care provider. Exercise helps your back heal faster. It also helps avoid future injury by keeping your muscles strong and flexible.  Do not stay in bed.Resting more than 1-2 days can delay your recovery.  Pay attention to your body when you bend and lift. The most comfortable positions are those that put less stress on your recovering back. Always use proper lifting techniques, including:  Bending your knees.  Keeping the load close to your body.  Avoiding twisting.  Find a comfortable position to sleep. Use a firm mattress and lie on your side with your knees slightly bent. If you lie on your back, put a pillow under your knees.  Avoid feeling anxious or stressed.Stress increases muscle tension and can worsen back pain.It is important to recognize when you are anxious or stressed and learn ways to manage it, such as with exercise.  Take medicines only as directed by your health care provider. Over-the-counter medicines to reduce pain and inflammation are often the most helpful.Your health care provider may prescribe muscle relaxant drugs.These medicines help dull your pain so you can more quickly return to your normal activities and healthy exercise.  Apply ice to the injured area:  Put ice in a plastic bag.  Place a towel between your skin and the bag.  Leave the ice on for 20 minutes, 2-3 times a day for the first 2-3 days. After that, ice and heat may be alternated to reduce pain and spasms.  Maintain a healthy weight. Excess weight puts extra stress on your back and makes it difficult to maintain good posture. Contact a health care provider if:  You have pain that is not relieved with rest or medicine.  You have increasing pain going down into the legs or buttocks.  You have pain that does not improve in  one week.  You have night pain.  You lose weight.  You have a fever or chills. Get help right away if:  You develop new bowel or bladder control problems.  You have unusual weakness  or numbness in your arms or legs.  You develop nausea or vomiting.  You develop abdominal pain.  You feel faint. This information is not intended to replace advice given to you by your health care provider. Make sure you discuss any questions you have with your health care provider. Document Released: 07/21/2005 Document Revised: 11/29/2015 Document Reviewed: 11/22/2013 Elsevier Interactive Patient Education  2017 [employer]  Sciatica Sciatica is pain, numbness, weakness, or tingling along the path of the sciatic nerve. The sciatic nerve starts in the lower back and runs down the back of each leg. The nerve controls the muscles in the lower leg and in the back of the knee. It also provides feeling (sensation) to the back of the thigh, the lower leg, and the sole of the foot. Sciatica is a symptom of another medical condition that pinches or puts pressure on the sciatic nerve. Generally, sciatica only affects one side of the body. Sciatica usually goes away on its own or with treatment. In some cases, sciatica may keep coming back (recur). What are the causes? This condition is caused by pressure on the sciatic nerve, or pinching of the sciatic nerve. This may be the result of:  A disk in between the bones of the spine (vertebrae) bulging out too far (herniated disk).  Age-related changes in the spinal disks (degenerative disk disease).  A pain disorder that affects a muscle in the buttock (piriformis syndrome).  Extra bone growth (bone spur) near the sciatic nerve.  An injury or break (fracture) of the pelvis.  Pregnancy.  Tumor (rare). What increases the risk? The following factors may make you more likely to develop this condition:  Playing sports that place pressure or stress on the  spine, such as football or weight lifting.  Having poor strength and flexibility.  A history of back injury.  A history of back surgery.  Sitting for long periods of time.  Doing activities that involve repetitive bending or lifting.  Obesity. What are the signs or symptoms? Symptoms can vary from mild to very severe, and they may include:  Any of these problems in the lower back, leg, hip, or buttock:  Mild tingling or dull aches.  Burning sensations.  Sharp pains.  Numbness in the back of the calf or the sole of the foot.  Leg weakness.  Severe back pain that makes movement difficult. These symptoms may get worse when you cough, sneeze, or laugh, or when you sit or stand for long periods of time. Being overweight may also make symptoms worse. In some cases, symptoms may recur over time. How is this diagnosed? This condition may be diagnosed based on:  Your symptoms.  A physical exam. Your health care provider may ask you to do certain movements to check whether those movements trigger your symptoms.  You may have tests, including:  Blood tests.  X-rays.  MRI.  CT scan. How is this treated? In many cases, this condition improves on its own, without any treatment. However, treatment may include:  Reducing or modifying physical activity during periods of pain.  Exercising and stretching to strengthen your abdomen and improve the flexibility of your spine.  Icing and applying heat to the affected area.  Medicines that help:  To relieve pain and swelling.  To relax your muscles.  Injections of medicines that help to relieve pain, irritation, and inflammation around the sciatic nerve (steroids).  Surgery. Follow these instructions at home: Medicines   Take over-the-counter and prescription medicines  only as told by your health care provider.  Do not drive or operate heavy machinery while taking prescription pain medicine. Managing pain   If  directed, apply ice to the affected area.  Put ice in a plastic bag.  Place a towel between your skin and the bag.  Leave the ice on for 20 minutes, 2-3 times a day.  After icing, apply heat to the affected area before you exercise or as often as told by your health care provider. Use the heat source that your health care provider recommends, such as a moist heat pack or a heating pad.  Place a towel between your skin and the heat source.  Leave the heat on for 20-30 minutes.  Remove the heat if your skin turns bright red. This is especially important if you are unable to feel pain, heat, or cold. You may have a greater risk of getting burned. Activity   Return to your normal activities as told by your health care provider. Ask your health care provider what activities are safe for you.  Avoid activities that make your symptoms worse.  Take brief periods of rest throughout the day. Resting in a lying or standing position is usually better than sitting to rest.  When you rest for longer periods, mix in some mild activity or stretching between periods of rest. This will help to prevent stiffness and pain.  Avoid sitting for long periods of time without moving. Get up and move around at least one time each hour.  Exercise and stretch regularly, as told by your health care provider.  Do not lift anything that is heavier than 10 lb (4.5 kg) while you have symptoms of sciatica. When you do not have symptoms, you should still avoid heavy lifting, especially repetitive heavy lifting.  When you lift objects, always use proper lifting technique, which includes:  Bending your knees.  Keeping the load close to your body.  Avoiding twisting. General instructions   Use good posture.  Avoid leaning forward while sitting.  Avoid hunching over while standing.  Maintain a healthy weight. Excess weight puts extra stress on your back and makes it difficult to maintain good posture.  Wear  supportive, comfortable shoes. Avoid wearing high heels.  Avoid sleeping on a mattress that is too soft or too hard. A mattress that is firm enough to support your back when you sleep may help to reduce your pain.  Keep all follow-up visits as told by your health care provider. This is important. Contact a health care provider if:  You have pain that wakes you up when you are sleeping.  You have pain that gets worse when you lie down.  Your pain is worse than you have experienced in the past.  Your pain lasts longer than 4 weeks.  You experience unexplained weight loss. Get help right away if:  You lose control of your bowel or bladder (incontinence).  You have:  Weakness in your lower back, pelvis, buttocks, or legs that gets worse.  Redness or swelling of your back.  A burning sensation when you urinate. This information is not intended to replace advice given to you by your health care provider. Make sure you discuss any questions you have with your health care provider. Document Released: 07/15/2001 Document Revised: 12/25/2015 Document Reviewed: 03/30/2015 Elsevier Interactive Patient Education  2017 [employer].   Sciatica Rehab Ask your health care provider which exercises are safe for you. Do exercises exactly as told by your  health care provider and adjust them as directed. It is normal to feel mild stretching, pulling, tightness, or discomfort as you do these exercises, but you should stop right away if you feel sudden pain or your pain gets worse.Do not begin these exercises until told by your health care provider. Stretching and range of motion exercises These exercises warm up your muscles and joints and improve the movement and flexibility of your hips and your back. These exercises also help to relieve pain, numbness, and tingling. Exercise A: Sciatic nerve glide  1. Sit in a chair with your head facing down toward your chest. Place your hands behind your back.  Let your shoulders slump forward. 2. Slowly straighten one of your knees while you tilt your head back as if you are looking toward the ceiling. Only straighten your leg as far as you can without making your symptoms worse. 3. Hold for __________ seconds. 4. Slowly return to the starting position. 5. Repeat with your other leg. Repeat __________ times. Complete this exercise __________ times a day. Exercise B: Knee to chest with hip adduction and internal rotation   1. Lie on your back on a firm surface with both legs straight. 2. Bend one of your knees and move it up toward your chest until you feel a gentle stretch in your lower back and buttock. Then, move your knee toward the shoulder that is on the opposite side from your leg.  Hold your leg in this position by holding onto the front of your knee. 3. Hold for __________ seconds. 4. Slowly return to the starting position. 5. Repeat with your other leg. Repeat __________ times. Complete this exercise __________ times a day. Exercise C: Prone extension on elbows   1. Lie on your abdomen on a firm surface. A bed may be too soft for this exercise. 2. Prop yourself up on your elbows. 3. Use your arms to help lift your chest up until you feel a gentle stretch in your abdomen and your lower back.  This will place some of your body weight on your elbows. If this is uncomfortable, try stacking pillows under your chest.  Your hips should stay down, against the surface that you are lying on. Keep your hip and back muscles relaxed. 4. Hold for __________ seconds. 5. Slowly relax your upper body and return to the starting position. Repeat __________ times. Complete this exercise __________ times a day. Strengthening exercises These exercises build strength and endurance in your back. Endurance is the ability to use your muscles for a long time, even after they get tired. Exercise D: Pelvic tilt  1. Lie on your back on a firm surface. Bend your  knees and keep your feet flat. 2. Tense your abdominal muscles. Tip your pelvis up toward the ceiling and flatten your lower back into the floor.  To help with this exercise, you may place a small towel under your lower back and try to push your back into the towel. 3. Hold for __________ seconds. 4. Let your muscles relax completely before you repeat this exercise. Repeat __________ times. Complete this exercise __________ times a day. Exercise E: Alternating arm and leg raises   1. Get on your hands and knees on a firm surface. If you are on a hard floor, you may want to use padding to cushion your knees, such as an exercise mat. 2. Line up your arms and legs. Your hands should be below your shoulders, and your knees should be  below your hips. 3. Lift your left leg behind you. At the same time, raise your right arm and straighten it in front of you.  Do not lift your leg higher than your hip.  Do not lift your arm higher than your shoulder.  Keep your abdominal and back muscles tight.  Keep your hips facing the ground.  Do not arch your back.  Keep your balance carefully, and do not hold your breath. 4. Hold for __________ seconds. 5. Slowly return to the starting position and repeat with your right leg and your left arm. Repeat __________ times. Complete this exercise __________ times a day. Posture and body mechanics   Body mechanics refers to the movements and positions of your body while you do your daily activities. Posture is part of body mechanics. Good posture and healthy body mechanics can help to relieve stress in your body's tissues and joints. Good posture means that your spine is in its natural S-curve position (your spine is neutral), your shoulders are pulled back slightly, and your head is not tipped forward. The following are general guidelines for applying improved posture and body mechanics to your everyday activities. Standing    When standing, keep your spine  neutral and your feet about hip-width apart. Keep a slight bend in your knees. Your ears, shoulders, and hips should line up.  When you do a task in which you stand in one place for a long time, place one foot up on a stable object that is 2-4 inches (5-10 cm) high, such as a footstool. This helps keep your spine neutral. Sitting    When sitting, keep your spine neutral and keep your feet flat on the floor. Use a footrest, if necessary, and keep your thighs parallel to the floor. Avoid rounding your shoulders, and avoid tilting your head forward.  When working at a desk or a computer, keep your desk at a height where your hands are slightly lower than your elbows. Slide your chair under your desk so you are close enough to maintain good posture.  When working at a computer, place your monitor at a height where you are looking straight ahead and you do not have to tilt your head forward or downward to look at the screen. Resting    When lying down and resting, avoid positions that are most painful for you.  If you have pain with activities such as sitting, bending, stooping, or squatting (flexion-based activities), lie in a position in which your body does not bend very much. For example, avoid curling up on your side with your arms and knees near your chest (fetal position).  If you have pain with activities such as standing for a long time or reaching with your arms (extension-based activities), lie with your spine in a neutral position and bend your knees slightly. Try the following positions:  Lying on your side with a pillow between your knees.  Lying on your back with a pillow under your knees. Lifting    When lifting objects, keep your feet at least shoulder-width apart and tighten your abdominal muscles.  Bend your knees and hips and keep your spine neutral. It is important to lift using the strength of your legs, not your back. Do not lock your knees straight out.  Always ask  for help to lift heavy or awkward objects. This information is not intended to replace advice given to you by your health care provider. Make sure you discuss any questions you have  with your health care provider. Document Released: 07/21/2005 Document Revised: 03/27/2016 Document Reviewed: 04/06/2015 Elsevier Interactive Patient Education  2017 [employer].    IF you received an x-ray today, you will receive an invoice from [facility]. Please contact [facility] at [phone] with questions or concerns regarding your invoice.   IF you received labwork today, you will receive an invoice from [facility]. Please contact [facility] at [phone] with questions or concerns regarding your invoice.   Our billing staff will not be able to assist you with questions regarding bills from these companies.  You will be contacted with the lab results as soon as they are available. The fastest way to get your results is to activate your My Chart account. Instructions are located on the last page of this paperwork. If you have not heard from [place] regarding the results in 2 weeks, please contact this office.       I personally performed the services described in this documentation, which was scribed in my presence. The recorded information has been reviewed and considered for accuracy and completeness, addended by me as needed, and agree with information above.  Signed,   Merri Ray, MD [facility] [place].  11/25/16 1:40 PM

## 2016-12-30 ENCOUNTER — Ambulatory Visit (INDEPENDENT_AMBULATORY_CARE_PROVIDER_SITE_OTHER): Payer: BLUE CROSS/BLUE SHIELD | Admitting: Family Medicine

## 2016-12-30 ENCOUNTER — Encounter: Payer: Self-pay | Admitting: Family Medicine

## 2016-12-30 VITALS — BP 145/89 | HR 70 | Temp 98.6°F | Resp 18 | Ht 66.14 in | Wt 231.0 lb

## 2016-12-30 DIAGNOSIS — Z862 Personal history of diseases of the blood and blood-forming organs and certain disorders involving the immune mechanism: Secondary | ICD-10-CM

## 2016-12-30 DIAGNOSIS — R5383 Other fatigue: Secondary | ICD-10-CM | POA: Diagnosis not present

## 2016-12-30 DIAGNOSIS — R42 Dizziness and giddiness: Secondary | ICD-10-CM | POA: Diagnosis not present

## 2016-12-30 DIAGNOSIS — L299 Pruritus, unspecified: Secondary | ICD-10-CM

## 2016-12-30 DIAGNOSIS — R0609 Other forms of dyspnea: Secondary | ICD-10-CM

## 2016-12-30 LAB — POCT CBC
Granulocyte percent: 80.7 %G — AB (ref 37–80)
HEMATOCRIT: 38 % (ref 37.7–47.9)
Hemoglobin: 13.4 g/dL (ref 12.2–16.2)
LYMPH, POC: 1.5 (ref 0.6–3.4)
MCH, POC: 34 pg — AB (ref 27–31.2)
MCHC: 35.4 g/dL (ref 31.8–35.4)
MCV: 96.1 fL (ref 80–97)
MID (cbc): 0.1 (ref 0–0.9)
MPV: 7.9 fL (ref 0–99.8)
POC GRANULOCYTE: 6.9 (ref 2–6.9)
POC LYMPH %: 18.1 % (ref 10–50)
POC MID %: 1.2 %M (ref 0–12)
Platelet Count, POC: 355 10*3/uL (ref 142–424)
RBC: 3.96 M/uL — AB (ref 4.04–5.48)
RDW, POC: 12.5 %
WBC: 8.5 10*3/uL (ref 4.6–10.2)

## 2016-12-30 NOTE — Patient Instructions (Signed)
I suspect some of your symptoms may have been due to a viral infection, or fatigue with your stressors. Please see information below regarding stress and stress management, but I would like you to meet with a counselor. I provided numbers below. Let me know if other numbers are needed. Additionally number for Hospice is listed below as group counseling sessions may be beneficial with grieving.  Let me know if you need other resources, and follow-up with me in the next few weeks if your symptoms have not improved. Sooner if worse.   Rash on arms may be due to sunburn, okay to use Aveeno lotion. If you continue to have itching or rash of arms in the next 2 weeks, return to recheck. Sooner if worse.  Vivia Budge: [phone] Arvil Chaco: [phone]  Hospice - counseling scheduling: [phone]    Stress and Stress Management Stress is a normal reaction to life events. It is what you feel when life demands more than you are used to or more than you can handle. Some stress can be useful. For example, the stress reaction can help you catch the last bus of the day, study for a test, or meet a deadline at work. But stress that occurs too often or for too long can cause problems. It can affect your emotional health and interfere with relationships and normal daily activities. Too much stress can weaken your immune system and increase your risk for physical illness. If you already have a medical problem, stress can make it worse. What are the causes? All sorts of life events may cause stress. An event that causes stress for one person may not be stressful for another person. Major life events commonly cause stress. These may be positive or negative. Examples include losing your job, moving into a new home, getting married, having a baby, or losing a loved one. Less obvious life events may also cause stress, especially if they occur day after day or in combination. Examples include working long hours, driving in  traffic, caring for children, being in debt, or being in a difficult relationship. What are the signs or symptoms? Stress may cause emotional symptoms including, the following:  Anxiety. This is feeling worried, afraid, on edge, overwhelmed, or out of control.  Anger. This is feeling irritated or impatient.  Depression. This is feeling sad, down, helpless, or guilty.  Difficulty focusing, remembering, or making decisions. Stress may cause physical symptoms, including the following:  Aches and pains. These may affect your head, neck, back, stomach, or other areas of your body.  Tight muscles or clenched jaw.  Low energy or trouble sleeping. Stress may cause unhealthy behaviors, including the following:  Eating to feel better (overeating) or skipping meals.  Sleeping too little, too much, or both.  Working too much or putting off tasks (procrastination).  Smoking, drinking alcohol, or using drugs to feel better. How is this diagnosed? Stress is diagnosed through an assessment by your health care provider. Your health care provider will ask questions about your symptoms and any stressful life events.Your health care provider will also ask about your medical history and may order blood tests or other tests. Certain medical conditions and medicine can cause physical symptoms similar to stress. Mental illness can cause emotional symptoms and unhealthy behaviors similar to stress. Your health care provider may refer you to a mental health professional for further evaluation. How is this treated? Stress management is the recommended treatment for stress.The goals of stress management are reducing stressful  life events and coping with stress in healthy ways. Techniques for reducing stressful life events include the following:  Stress identification. Self-monitor for stress and identify what causes stress for you. These skills may help you to avoid some stressful events.  Time management.  Set your priorities, keep a calendar of events, and learn to say "no." These tools can help you avoid making too many commitments. Techniques for coping with stress include the following:  Rethinking the problem. Try to think realistically about stressful events rather than ignoring them or overreacting. Try to find the positives in a stressful situation rather than focusing on the negatives.  Exercise. Physical exercise can release both physical and emotional tension. The key is to find a form of exercise you enjoy and do it regularly.  Relaxation techniques. These relax the body and mind. Examples include yoga, meditation, tai chi, biofeedback, deep breathing, progressive muscle relaxation, listening to music, being out in nature, journaling, and other hobbies. Again, the key is to find one or more that you enjoy and can do regularly.  Healthy lifestyle. Eat a balanced diet, get plenty of sleep, and do not smoke. Avoid using alcohol or drugs to relax.  Strong support network. Spend time with family, friends, or other people you enjoy being around.Express your feelings and talk things over with someone you trust. Counseling or talktherapy with a mental health professional may be helpful if you are having difficulty managing stress on your own. Medicine is typically not recommended for the treatment of stress.Talk to your health care provider if you think you need medicine for symptoms of stress. Follow these instructions at home:  Keep all follow-up visits as directed by your health care provider.  Take all medicines as directed by your health care provider. Contact a health care provider if:  Your symptoms get worse or you start having new symptoms.  You feel overwhelmed by your problems and can no longer manage them on your own. Get help right away if:  You feel like hurting yourself or someone else. This information is not intended to replace advice given to you by your health care  provider. Make sure you discuss any questions you have with your health care provider. Document Released: 01/14/2001 Document Revised: 12/27/2015 Document Reviewed: 03/15/2013 Elsevier Interactive Patient Education  2017 [employer].   IF you received an x-ray today, you will receive an invoice from [facility]. Please contact [facility] at [phone] with questions or concerns regarding your invoice.   IF you received labwork today, you will receive an invoice from [facility]. Please contact [facility] at [phone] with questions or concerns regarding your invoice.   Our billing staff will not be able to assist you with questions regarding bills from these companies.  You will be contacted with the lab results as soon as they are available. The fastest way to get your results is to activate your My Chart account. Instructions are located on the last page of this paperwork. If you have not heard from [place] regarding the results in 2 weeks, please contact this office.

## 2016-12-30 NOTE — Progress Notes (Signed)
By signing my name below, I, Bethany [name], attest that this documentation has been prepared under the direction and in the presence of Merri Ray, MD.  Electronically Signed: Verlee Monte, Medical Scribe. 12/30/16. 2:39 PM.  Subjective:    Patient ID: Bethany [name], female    DOB: 04/01/1976, 41 y.o.   MRN: [id number]  HPI Chief Complaint  Patient presents with  . Fatigue    x 1 week with some bodyaches   . Nausea    HPI Comments: Bethany [name] is a 41 y.o. female who presents to Primary Care at [facility] complaining of fatigue onset 8 days ago. Reports associated sxs of myalgias, nausea, dizziness with activity, SOB after going up the stairs, itching that started on her left arm, and left neck soreness. Reports itching started on her left arm when it radiated all over her body and left a soreness in her arm - loctaed the itchy spot inside her left forearm. Pt started her menses around the same time of onset and it was typically heavy. Also around onset of sxs she started [facility], rx'ed by her OB/GYN.  Pt recalls feeling bugs crawling on her every now and then, but not frequently. [facility] on the side of her neck and was told she had a slightly enlarged gland on the side of her neck - no ear infection. Pt had a sinus infection a few weeks ago and it was moving all through her house. Pt initially took pain medication for her neck, but she's no longer taking medication. Pt has not been on any abx or new medications. Pt has not been out camping. Denies fever, chest pain, palpitations, and visual/audible hallucinations.  Situational Stress: Pt tearfully reports her dad passed from  [place] in October. He found out when he went to the doctor for unrelated leg pain. Pt's brother had leg surgery, leaving him disable, and her mom went to the hospital for a nervous break down, leaving her in the hospital for a month. Pt recently had to terminate her pregnancy, and  her daughter is showing mental health signs. Pt has not been on medication for her anxiety or depression. Pt has not met with a counselor for her situational stressors since she keeps running out of time. She has not missed any doses of her HTN medication.  Patient Active Problem List   Diagnosis Date Noted  . Anemia 07/10/2016  . Placental abruption 02/01/2015  . Severe preeclampsia 02/01/2015  . H/O cesarean section 01/29/2015  . Uterine fibroid   . Hypertension 06/23/2013  . PCOS (polycystic ovarian syndrome) 06/23/2013  . Palpitations 06/23/2013   Past Medical History:  Diagnosis Date  . Anemia   . Chronic hypertension during pregnancy, antepartum 02/01/2015  . GERD (gastroesophageal reflux disease)   . Hypertension   . Uterine hemorrhage 02/01/2015   Past Surgical History:  Procedure Laterality Date  . CESAREAN SECTION N/A 01/29/2015   Procedure: CESAREAN SECTION;  Surgeon: Newton Pigg, MD;  Location: [facility];  Service: Obstetrics;  Laterality: N/A;  . HERNIA REPAIR     No Known Allergies Prior to Admission medications   Medication Sig Start Date End Date Taking? Authorizing Provider  amLODipine (NORVASC) 5 MG tablet Take 5 mg by mouth daily.    [provider]  Ferrous Gluconate-C-Folic Acid (IRON-C PO) Take 65 mg by mouth.    [provider]  Ferrous Sulfate (IRON SUPPLEMENT PO) Take by mouth continuous  as needed.    [provider]  losartan-hydrochlorothiazide (HYZAAR) 100-25 MG tablet TAKE 1 TABLET BY MOUTH DAILY. 11/18/16   Jaynee Eagles, PA-C   Social History   Social History  . Marital status: Married    Spouse name: N/A  . Number of children: N/A  . Years of education: N/A   Occupational History  . Not on file.   Social History Main Topics  . Smoking status: Never Smoker  . Smokeless tobacco: Never Used  . Alcohol use No  . Drug use: No  . Sexual activity: Yes    Birth control/ protection: None   Other Topics Concern  . Not on  file   Social History Narrative  . No narrative on file   Review of Systems  Constitutional: Negative for fever.  Respiratory: Positive for shortness of breath.   Cardiovascular: Negative for chest pain and palpitations.  Gastrointestinal: Positive for nausea.  Musculoskeletal: Positive for myalgias and neck pain.  Neurological: Positive for dizziness.  Psychiatric/Behavioral: Positive for dysphoric mood. Negative for hallucinations. The patient is nervous/anxious.    Objective:  Physical Exam  Constitutional: She appears well-developed and well-nourished. No distress.  HENT:  Head: Normocephalic and atraumatic.  Right Ear: Tympanic membrane, external ear and ear canal normal.  Left Ear: Tympanic membrane, external ear and ear canal normal.  Mouth/Throat: Oropharynx is clear and moist. No oropharyngeal exudate.  Eyes: Conjunctivae are normal.  Neck: Neck supple.  Cardiovascular: Normal rate, regular rhythm and normal heart sounds.  Exam reveals no gallop and no friction rub.   No murmur heard. Pulmonary/Chest: Effort normal and breath sounds normal. No respiratory distress. She has no wheezes. She has no rales.  Abdominal: Soft. There is no tenderness.  Lymphadenopathy:    She has no cervical adenopathy.  Neurological: She is alert.  Skin: Skin is warm and dry.  Few areas of faint hyperpigmentation across her dorsal forearms  Psychiatric: She has a normal mood and affect. Her behavior is normal.  Nursing note and vitals reviewed.   Vitals:   12/30/16 1432  BP: (!) 145/89  Pulse: 70  Resp: 18  Temp: 98.6 F (37 C)  TempSrc: Oral  SpO2: 100%  Weight: 231 lb (104.8 kg)  Height: 5' 6.14" (1.68 m)   Body mass index is 37.12 kg/m.   [EKG [name]]: Sinus rhythms. No acute changes.  Lab Results  Component Value Date   WBC 8.5 12/30/2016   HGB 13.4 12/30/2016   HCT 38.0 12/30/2016   MCV 96.1 12/30/2016   PLT 396 (H) 07/10/2016   Assessment & Plan:   Bethany [name] is a 41 y.o. female Pruritus  - Possible sunburn dorsal forearms versus less likely tinea versicolor. Aveeno over-the-counter, RTC precautions if persistent over the next few weeks. Advised that her medications may make her more photosensitive.  Fatigue, unspecified type - Plan: EKG 12-Lead History of anemia - Plan: POCT CBC Dyspnea on exertion Dizziness  - Reassuring CBC, EKG. Significant stressors with loss of parent, loss of pregnancy, other stressors as above. Discussed meeting with counselor, and hospice counseling. Stress management given on handout. Plans on updating me on her current symptoms within a week, and follow-up in 2 weeks if not significantly improved. RTC precautions if worsening.  No orders of the defined types were placed in this encounter.  Patient Instructions   I suspect some of your symptoms may have been due to a viral infection, or fatigue with your stressors. Please see information  below regarding stress and stress management, but I would like you to meet with a counselor. I provided numbers below. Let me know if other numbers are needed. Additionally number for Hospice is listed below as group counseling sessions may be beneficial with grieving.  Let me know if you need other resources, and follow-up with me in the next few weeks if your symptoms have not improved. Sooner if worse.   Rash on arms may be due to sunburn, okay to use Aveeno lotion. If you continue to have itching or rash of arms in the next 2 weeks, return to recheck. Sooner if worse.  Vivia Budge: [phone] Arvil Chaco: [phone]  Hospice - counseling scheduling: [phone]    Stress and Stress Management Stress is a normal reaction to life events. It is what you feel when life demands more than you are used to or more than you can handle. Some stress can be useful. For example, the stress reaction can help you catch the last bus of the day, study for a test, or meet a deadline at  work. But stress that occurs too often or for too long can cause problems. It can affect your emotional health and interfere with relationships and normal daily activities. Too much stress can weaken your immune system and increase your risk for physical illness. If you already have a medical problem, stress can make it worse. What are the causes? All sorts of life events may cause stress. An event that causes stress for one person may not be stressful for another person. Major life events commonly cause stress. These may be positive or negative. Examples include losing your job, moving into a new home, getting married, having a baby, or losing a loved one. Less obvious life events may also cause stress, especially if they occur day after day or in combination. Examples include working long hours, driving in traffic, caring for children, being in debt, or being in a difficult relationship. What are the signs or symptoms? Stress may cause emotional symptoms including, the following:  Anxiety. This is feeling worried, afraid, on edge, overwhelmed, or out of control.  Anger. This is feeling irritated or impatient.  Depression. This is feeling sad, down, helpless, or guilty.  Difficulty focusing, remembering, or making decisions. Stress may cause physical symptoms, including the following:  Aches and pains. These may affect your head, neck, back, stomach, or other areas of your body.  Tight muscles or clenched jaw.  Low energy or trouble sleeping. Stress may cause unhealthy behaviors, including the following:  Eating to feel better (overeating) or skipping meals.  Sleeping too little, too much, or both.  Working too much or putting off tasks (procrastination).  Smoking, drinking alcohol, or using drugs to feel better. How is this diagnosed? Stress is diagnosed through an assessment by your health care provider. Your health care provider will ask questions about your symptoms and any  stressful life events.Your health care provider will also ask about your medical history and may order blood tests or other tests. Certain medical conditions and medicine can cause physical symptoms similar to stress. Mental illness can cause emotional symptoms and unhealthy behaviors similar to stress. Your health care provider may refer you to a mental health professional for further evaluation. How is this treated? Stress management is the recommended treatment for stress.The goals of stress management are reducing stressful life events and coping with stress in healthy ways. Techniques for reducing stressful life events include the following:  Stress identification.  Self-monitor for stress and identify what causes stress for you. These skills may help you to avoid some stressful events.  Time management. Set your priorities, keep a calendar of events, and learn to say "no." These tools can help you avoid making too many commitments. Techniques for coping with stress include the following:  Rethinking the problem. Try to think realistically about stressful events rather than ignoring them or overreacting. Try to find the positives in a stressful situation rather than focusing on the negatives.  Exercise. Physical exercise can release both physical and emotional tension. The key is to find a form of exercise you enjoy and do it regularly.  Relaxation techniques. These relax the body and mind. Examples include yoga, meditation, tai chi, biofeedback, deep breathing, progressive muscle relaxation, listening to music, being out in nature, journaling, and other hobbies. Again, the key is to find one or more that you enjoy and can do regularly.  Healthy lifestyle. Eat a balanced diet, get plenty of sleep, and do not smoke. Avoid using alcohol or drugs to relax.  Strong support network. Spend time with family, friends, or other people you enjoy being around.Express your feelings and talk things over  with someone you trust. Counseling or talktherapy with a mental health professional may be helpful if you are having difficulty managing stress on your own. Medicine is typically not recommended for the treatment of stress.Talk to your health care provider if you think you need medicine for symptoms of stress. Follow these instructions at home:  Keep all follow-up visits as directed by your health care provider.  Take all medicines as directed by your health care provider. Contact a health care provider if:  Your symptoms get worse or you start having new symptoms.  You feel overwhelmed by your problems and can no longer manage them on your own. Get help right away if:  You feel like hurting yourself or someone else. This information is not intended to replace advice given to you by your health care provider. Make sure you discuss any questions you have with your health care provider. Document Released: 01/14/2001 Document Revised: 12/27/2015 Document Reviewed: 03/15/2013 Elsevier Interactive Patient Education  2017 [employer].   IF you received an x-ray today, you will receive an invoice from [facility]. Please contact [facility] at [phone] with questions or concerns regarding your invoice.   IF you received labwork today, you will receive an invoice from [facility]. Please contact [facility] at [phone] with questions or concerns regarding your invoice.   Our billing staff will not be able to assist you with questions regarding bills from these companies.  You will be contacted with the lab results as soon as they are available. The fastest way to get your results is to activate your My Chart account. Instructions are located on the last page of this paperwork. If you have not heard from [place] regarding the results in 2 weeks, please contact this office.       I personally performed the services described in this documentation, which was scribed in my  presence. The recorded information has been reviewed and considered for accuracy and completeness, addended by me as needed, and agree with information above.  Signed,   Merri Ray, MD [facility] at [place].  12/30/16 3:40 PM

## 2017-01-05 ENCOUNTER — Encounter: Payer: Self-pay | Admitting: Family Medicine

## 2017-01-05 DIAGNOSIS — R9089 Other abnormal findings on diagnostic imaging of central nervous system: Secondary | ICD-10-CM

## 2017-01-05 DIAGNOSIS — I10 Essential (primary) hypertension: Secondary | ICD-10-CM

## 2017-01-06 ENCOUNTER — Encounter: Payer: Self-pay | Admitting: Family Medicine

## 2017-01-30 ENCOUNTER — Other Ambulatory Visit: Payer: Self-pay | Admitting: Urgent Care

## 2017-01-30 DIAGNOSIS — I10 Essential (primary) hypertension: Secondary | ICD-10-CM

## 2017-02-05 NOTE — Telephone Encounter (Signed)
PHARMACY CALLING ABOUT PT RX [place] THEY STATES THAT THEY SENT IN A REQUEST [address]

## 2017-02-06 NOTE — Telephone Encounter (Signed)
mychart message sent to pt about making an appt °

## 2017-03-06 ENCOUNTER — Ambulatory Visit (INDEPENDENT_AMBULATORY_CARE_PROVIDER_SITE_OTHER): Payer: BLUE CROSS/BLUE SHIELD | Admitting: Family Medicine

## 2017-03-06 ENCOUNTER — Encounter: Payer: Self-pay | Admitting: Family Medicine

## 2017-03-06 DIAGNOSIS — I10 Essential (primary) hypertension: Secondary | ICD-10-CM

## 2017-03-06 MED ORDER — LOSARTAN POTASSIUM-HCTZ 100-25 MG PO TABS: 1.0000 | ORAL_TABLET | Freq: Every day | ORAL | 1 refills | Status: DC

## 2017-03-06 MED ORDER — AMLODIPINE BESYLATE 10 MG PO TABS: 10.0000 mg | ORAL_TABLET | Freq: Every day | ORAL | 1 refills | Status: DC

## 2017-03-06 NOTE — Progress Notes (Signed)
Subjective:  By signing my name below, I, Bethany [name], attest that this documentation has been prepared under the direction and in the presence of Bethany Ray, MD. Electronically Signed: Moises [name], Force. 03/06/2017 , 6:09 PM .  Patient was seen in Room 12 .   Patient ID: Bethany [name], female    DOB: 21-Oct-1975, 41 y.o.   MRN: [id number] Chief Complaint  Patient presents with  . Medication Refill    Hyzaar 100-25 MG, Amlovipine 5 MG   HPI Bethany [name] is a 41 y.o. female Here for HTN medication refill. Her last meal was at 12:00PM.   HTN She takes Hyzaar 100-25 mg QD and Amlodipine 5 mg QD. She takes her medications every morning daily.   Lab Results  Component Value Date   CREATININE 0.90 02/18/2016   She hasn't been checking her BP outside of the office. She had a cup of coffee at around 2:00PM. Her BP was 145/89 at last visit on May 29th. She will buy a new BP cuff. She denies chest pain, shortness of breath, lightheadedness, or dizziness. She has an occasional glass of wine, and hasn't increased any intake.   Weight/diet Wt Readings from Last 3 Encounters:  03/06/17 221 lb 9.6 oz (100.5 kg)  12/30/16 231 lb (104.8 kg)  11/25/16 237 lb 6.4 oz (107.7 kg)   She's been fasting intermittently and keto diet. She denies use of appetite suppressants.   Vision She recently saw her optometrist when she started seeing floaters and occasional flash of light. She was informed there was no concern.   Headache She has an appointment with her neurologist in 4 days. She notes her headaches rarely occurs.   Patient Active Problem List   Diagnosis Date Noted  . Anemia 07/10/2016  . Placental abruption 02/01/2015  . Severe preeclampsia 02/01/2015  . H/O cesarean section 01/29/2015  . Uterine fibroid   . Hypertension 06/23/2013  . PCOS (polycystic ovarian syndrome) 06/23/2013  . Palpitations 06/23/2013   Past Medical History:  Diagnosis Date  . Anemia   . Chronic  hypertension during pregnancy, antepartum 02/01/2015  . GERD (gastroesophageal reflux disease)   . Hypertension   . Uterine hemorrhage 02/01/2015   Past Surgical History:  Procedure Laterality Date  . CESAREAN SECTION N/A 01/29/2015   Procedure: CESAREAN SECTION;  Surgeon: Bethany Pigg, MD;  Location: [facility];  Service: Obstetrics;  Laterality: N/A;  . HERNIA REPAIR     No Known Allergies Prior to Admission medications   Medication Sig Start Date End Date Taking? Authorizing Provider  amLODipine (NORVASC) 5 MG tablet Take 5 mg by mouth daily.    [provider]  Ferrous Gluconate-C-Folic Acid (IRON-C PO) Take 65 mg by mouth.    [provider]  Ferrous Sulfate (IRON SUPPLEMENT PO) Take by mouth continuous as needed.    [provider]  losartan-hydrochlorothiazide (HYZAAR) 100-25 MG tablet TAKE 1 TABLET BY MOUTH DAILY. 02/06/17   Bethany Agreste, MD   Social History   Social History  . Marital status: Married    Spouse name: N/A  . Number of children: N/A  . Years of education: N/A   Occupational History  . Not on file.   Social History Main Topics  . Smoking status: Never Smoker  . Smokeless tobacco: Never Used  . Alcohol use No  . Drug use: No  . Sexual activity: Yes    Birth control/ protection: None   Other Topics Concern  . Not  on file   Social History Narrative  . No narrative on file   Review of Systems  Constitutional: Negative for fatigue and unexpected weight change.  Respiratory: Negative for chest tightness and shortness of breath.   Cardiovascular: Negative for chest pain, palpitations and leg swelling.  Gastrointestinal: Negative for abdominal pain and [name] in stool.  Neurological: Negative for dizziness, syncope, light-headedness and headaches.       Objective:   Physical Exam  Constitutional: She is oriented to person, place, and time. She appears well-developed and well-nourished.  HENT:  Head: Normocephalic and  atraumatic.  Eyes: Pupils are equal, round, and reactive to light. Conjunctivae and EOM are normal.  Neck: Carotid bruit is not present.  Cardiovascular: Normal rate, regular rhythm, normal heart sounds and intact distal pulses.   Pulmonary/Chest: Effort normal and breath sounds normal.  Abdominal: Soft. She exhibits no pulsatile midline mass. There is no tenderness.  Musculoskeletal: She exhibits no edema.  Neurological: She is alert and oriented to person, place, and time.  Skin: Skin is warm and dry.  Psychiatric: She has a normal mood and affect. Her behavior is normal.  Vitals reviewed.   Vitals:   03/06/17 1738 03/06/17 1813  BP: (!) 155/103 (!) 145/99  Pulse: 77   Resp: 18   Temp: 98.3 F (36.8 C)   TempSrc: Oral   SpO2: 99%   Weight: 221 lb 9.6 oz (100.5 kg)   Height: 5' 6.5" (1.689 m)       Assessment & Plan:   Bethany [name] is a 41 y.o. female Essential hypertension - Plan: amLODipine (NORVASC) 10 MG tablet, losartan-hydrochlorothiazide (HYZAAR) 100-25 MG tablet, Basic metabolic panel  -Decreased control. Increased amlodipine to 10 mg daily, continue losartan HCT at same dose, BMP pending.  -Advised to let me know by my chart message regarding home readings on the new regimen, RTC precautions if new/worsening symptoms.  -recheck for physical in 3 months  Meds ordered this encounter  Medications  . amLODipine (NORVASC) 10 MG tablet    Sig: Take 1 tablet (10 mg total) by mouth daily.    Dispense:  90 tablet    Refill:  1  . losartan-hydrochlorothiazide (HYZAAR) 100-25 MG tablet    Sig: Take 1 tablet by mouth daily.    Dispense:  90 tablet    Refill:  1   Patient Instructions    Although [name] pressure improved on recheck in office, is still appears to be running a little bit too high.  Increase amlodipine to 10 mg once per day. You can double up on the 5 mg dose for right now, but I did send the 10 mg dose to your pharmacy as well. Continue your other  medication at same dose for now.   Keep a record of your [name] pressures outside of the office and send me an update through my chart in the next week or 2. Let me know if you have questions in the meantime.  IF you received an x-[name] today, you will receive an invoice from [facility]. Please contact [facility] at [phone] with questions or concerns regarding your invoice.   IF you received labwork today, you will receive an invoice from [facility]. Please contact [facility] at [phone] with questions or concerns regarding your invoice.   Our billing staff will not be able to assist you with questions regarding bills from these companies.  You will be contacted with the lab results as soon as they are available. The  fastest way to get your results is to activate your My Chart account. Instructions are located on the last page of this paperwork. If you have not heard from [place] regarding the results in 2 weeks, please contact this office.      I personally performed the services described in this documentation, which was scribed in my presence. The recorded information has been reviewed and considered for accuracy and completeness, addended by me as needed, and agree with information above.  Signed,   Bethany Ray, MD [facility].  03/06/17 6:18 PM

## 2017-03-06 NOTE — Patient Instructions (Addendum)
  Although blood pressure improved on recheck in office, is still appears to be running a little bit too high.  Increase amlodipine to 10 mg once per day. You can double up on the 5 mg dose for right now, but I did send the 10 mg dose to your pharmacy as well. Continue your other medication at same dose for now.   Keep a record of your blood pressures outside of the office and send me an update through my chart in the next week or 2. Let me know if you have questions in the meantime.  IF you received an x-ray today, you will receive an invoice from [facility]. Please contact [facility] at [phone] with questions or concerns regarding your invoice.   IF you received labwork today, you will receive an invoice from [facility]. Please contact [facility] at [phone] with questions or concerns regarding your invoice.   Our billing staff will not be able to assist you with questions regarding bills from these companies.  You will be contacted with the lab results as soon as they are available. The fastest way to get your results is to activate your My Chart account. Instructions are located on the last page of this paperwork. If you have not heard from [place] regarding the results in 2 weeks, please contact this office.

## 2017-03-07 LAB — BASIC METABOLIC PANEL
BUN / CREAT RATIO: 23 (ref 9–23)
BUN: 17 mg/dL (ref 6–20)
CO2: 23 mmol/L (ref 20–29)
CREATININE: 0.73 mg/dL (ref 0.57–1.00)
Calcium: 9.9 mg/dL (ref 8.7–10.2)
Chloride: 101 mmol/L (ref 96–106)
GFR calc Af Amer: 120 mL/min/{1.73_m2} (ref >59)
GFR calc non Af Amer: 104 mL/min/{1.73_m2} (ref >59)
GLUCOSE: 89 mg/dL (ref 65–99)
POTASSIUM: 4.2 mmol/L (ref 3.5–5.2)
SODIUM: 141 mmol/L (ref 134–144)

## 2017-03-10 ENCOUNTER — Encounter: Payer: Self-pay | Admitting: Neurology

## 2017-03-10 ENCOUNTER — Ambulatory Visit (INDEPENDENT_AMBULATORY_CARE_PROVIDER_SITE_OTHER): Payer: BLUE CROSS/BLUE SHIELD | Admitting: Neurology

## 2017-03-10 VITALS — BP 145/87 | HR 78 | Ht 67.0 in | Wt 222.0 lb

## 2017-03-10 DIAGNOSIS — G4482 Headache associated with sexual activity: Secondary | ICD-10-CM

## 2017-03-10 DIAGNOSIS — R202 Paresthesia of skin: Secondary | ICD-10-CM | POA: Diagnosis not present

## 2017-03-10 DIAGNOSIS — R930 Abnormal findings on diagnostic imaging of skull and head, not elsewhere classified: Secondary | ICD-10-CM | POA: Diagnosis not present

## 2017-03-10 DIAGNOSIS — R9082 White matter disease, unspecified: Secondary | ICD-10-CM | POA: Insufficient documentation

## 2017-03-10 HISTORY — DX: Headache associated with sexual activity: G44.82

## 2017-03-10 NOTE — Progress Notes (Signed)
Reason for visit: Abnormal MRI brain  Referring physician: Dr. Feliciana Rossetti is a 41 y.o. 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 The patient underwent MRI of the brain in May 2017 after she experienced several coital headaches. MRA of the head was done at the same time and appeared unremarkable. The MRI at that time showed evidence of some bifrontal white matter abnormalities. No evidence of brainstem or cerebellar lesions were seen. The patient has not had any further headaches, she denies any numbness or weakness of extremities until May 2018. The patient began experiencing significant fatigue for 1 or 2 weeks, then she developed itching sensations and paresthesias and shock sensations throughout the body including all 4 extremities. The patient has had gradual improvement of the symptoms, she still has some residual itching on the arms and legs. The patient denied any weakness with the sensory changes, she felt slight dizziness or imbalance, but she did not have any falls. The patient denies any difficulty controlling the bowels or the bladder. She is not had any visual changes, she does have floaters. She does have some underlying anxiety problems however. She has been under a lot of stress as her father has died and her mother has been admitted for psychiatric reasons and her sister has been diagnosed with an anxiety disorder. She has had take over the finances of the family. The patient is sent to this office for further evaluation.  Past Medical History:  Diagnosis Date  . Anemia   . Chronic hypertension during pregnancy, antepartum 02/01/2015  . GERD (gastroesophageal reflux disease)   . Hypertension   . Preeclampsia   . Uterine hemorrhage 02/01/2015     Past Surgical History:  Procedure Laterality Date  . CESAREAN SECTION N/A 01/29/2015   Procedure: CESAREAN SECTION;  Surgeon: Newton Pigg, MD;  Location: [facility];  Service: Obstetrics;  Laterality: N/A;  . HERNIA REPAIR      Family History  Problem Relation Age of Onset  . Hypertension Mother   . Cancer Father   . Diabetes Father   . Hypertension Father   . Heart attack Paternal Grandmother   . Heart attack Paternal Grandfather   . Diabetes Brother   . Kidney failure Brother     Social history:  reports that she has never smoked. She has never used smokeless tobacco. She reports that she does not drink alcohol or use drugs.  Medications:  Prior to Admission medications   Medication Sig Start Date End Date Taking? Authorizing Provider  amLODipine (NORVASC) 10 MG tablet Take 1 tablet (10 mg total) by mouth daily. 03/06/17  Yes Wendie Agreste, MD  Ferrous Gluconate-C-Folic Acid (IRON-C PO) Take 65 mg by mouth.   Yes [provider]  Ferrous Sulfate (IRON SUPPLEMENT PO) Take by mouth continuous as needed.   Yes [provider]  losartan-hydrochlorothiazide (HYZAAR) 100-25 MG tablet Take 1 tablet by mouth daily. 03/06/17  Yes Wendie Agreste, MD     No Known Allergies  ROS:  Out of a complete 14 system review of symptoms, the patient complains only of the following symptoms, and all other reviewed systems are negative.  Anxiety and depression Itching  Blood pressure (!) 145/87, pulse 78, height 5\' 7"  (1.702 m), weight  222 lb (100.7 kg), unknown if currently breastfeeding.  Physical Exam  General: The patient is alert and cooperative at the time of the examination. The patient is moderately obese.  Eyes: Pupils are equal, round, and reactive to light. Discs are flat bilaterally.  Neck: The neck is supple, no carotid bruits are noted.  Respiratory: The respiratory examination is clear.  Cardiovascular: The cardiovascular examination reveals a  regular rate and rhythm, a grade I/VI systolic murmur is noted in the aortic area.  Skin: Extremities are without significant edema.  Neurologic Exam  Mental status: The patient is alert and oriented x 3 at the time of the examination. The patient has apparent normal recent and remote memory, with an apparently normal attention span and concentration ability.  Cranial nerves: Facial symmetry is present. There is good sensation of the face to pinprick and soft touch bilaterally. The strength of the facial muscles and the muscles to head turning and shoulder shrug are normal bilaterally. Speech is well enunciated, no aphasia or dysarthria is noted. Extraocular movements are full. Visual fields are full. The tongue is midline, and the patient has symmetric elevation of the soft palate. No obvious hearing deficits are noted.  Motor: The motor testing reveals 5 over 5 strength of all 4 extremities. Good symmetric motor tone is noted throughout.  Sensory: Sensory testing is intact to pinprick, soft touch, vibration sensation, and position sense on all 4 extremities. No evidence of extinction is noted.  Coordination: Cerebellar testing reveals good finger-nose-finger and heel-to-shin bilaterally.  Gait and station: Gait is normal. Tandem gait is normal. Romberg is negative. No drift is seen.  Reflexes: Deep tendon reflexes are symmetric and normal bilaterally. Toes are downgoing bilaterally.   MRI brain 12/12/15:  IMPRESSION: 1. No acute intracranial abnormality. 2. Mild cerebral white matter T2 signal changes, abnormal for age and nonspecific. Considerations include chronic small vessel ischemia, sequelae of trauma, hypercoagulable state, vasculitis, migraines, prior infection and demyelination. 3. Partially empty sella, typically an incidental finding though can be seen in the setting of intracranial hypertension. 4. Negative head MRA.  * MRI scan images were reviewed online. I agree with  the written report.    Assessment/Plan:  1. Abnormal MRI brain  2. Paresthesias, all 4 extremities  The patient does have a long-standing history of hypertension and multiple episodes of preeclampsia. This history may be an explanation for the white matter changes seen on the MRI of the brain. However, the patient had a recent event of fatigue with onset of paresthesias on all 4 extremities that would be quite typical for an exacerbation of multiple sclerosis. The patient will need a workup to exclude demyelinating disease. The patient will be sent for blood work today, she will have MRI of the brain with and without gadolinium enhancement and MRI of the cervical spine as well. The patient will follow-up in 4 months, we will consider further evaluation depending upon the results of the above evaluation. I have suggested that the patient get a blood pressure cuff and follow her blood pressures on a regular basis.  Bethany Alexanders MD 03/10/2017 9:41 AM  [facility] [address] [place], [place] [address]  Phone [phone] Fax [phone]

## 2017-03-10 NOTE — Patient Instructions (Signed)
   We will get MRI of the brain and neck and get blood work.

## 2017-03-11 LAB — B. BURGDORFI ANTIBODIES: Lyme IgG/IgM Ab: <0.91 {ISR} (ref 0.00–0.90)

## 2017-03-11 LAB — PAN-ANCA
ANCA Proteinase 3: <3.5 U/mL (ref 0.0–3.5)
C-ANCA: <1:20 {titer}
Myeloperoxidase Ab: <9 U/mL (ref 0.0–9.0)

## 2017-03-11 LAB — ANA W/REFLEX: Anti Nuclear Antibody(ANA): NEGATIVE

## 2017-03-11 LAB — VITAMIN B12: Vitamin B-12: 607 pg/mL (ref 232–1245)

## 2017-03-11 LAB — SYPHILIS: RPR W/REFLEX TO RPR TITER AND TREPONEMAL ANTIBODIES, TRADITIONAL SCREENING AND DIAGNOSIS ALGORITHM: RPR Ser Ql: NONREACTIVE

## 2017-03-11 LAB — RHEUMATOID FACTOR: RHEUMATOID FACTOR: 23 [IU]/mL — AB (ref 0.0–13.9)

## 2017-03-11 LAB — HIV ANTIBODY (ROUTINE TESTING W REFLEX): HIV Screen 4th Generation wRfx: NONREACTIVE

## 2017-03-11 LAB — ANGIOTENSIN CONVERTING ENZYME: Angio Convert Enzyme: 31 U/L (ref 14–82)

## 2017-03-22 ENCOUNTER — Ambulatory Visit
Admission: RE | Admit: 2017-03-22 | Discharge: 2017-03-22 | Disposition: A | Discharge status: Home / Self Care | Payer: BLUE CROSS/BLUE SHIELD | Source: Ambulatory Visit | Attending: Neurology | Admitting: Neurology

## 2017-03-22 DIAGNOSIS — R202 Paresthesia of skin: Secondary | ICD-10-CM

## 2017-03-22 DIAGNOSIS — R930 Abnormal findings on diagnostic imaging of skull and head, not elsewhere classified: Secondary | ICD-10-CM | POA: Diagnosis not present

## 2017-03-22 DIAGNOSIS — R9082 White matter disease, unspecified: Secondary | ICD-10-CM

## 2017-03-22 MED ORDER — GADOBENATE DIMEGLUMINE 529 MG/ML IV SOLN
20.0000 mL | Freq: Once | INTRAVENOUS | Status: AC | PRN
  Administered 2017-03-22: 20 mL via INTRAVENOUS

## 2017-03-23 ENCOUNTER — Telehealth: Payer: Self-pay | Admitting: Neurology

## 2017-03-23 NOTE — Telephone Encounter (Signed)
I called the patient. MRI of the brain has not yet been formally read, by my reading continues to show some punctate white matter changes that were present before, there may be some mild progression from the prior study done in May 2017. The changes do not appear to be typical for demyelinating disease.  MRI of the cervical spine does not show spinal cord compression or lesions within the cord, no evidence of demyelinating disease.  Blood work has been normal, the patient likely has a more benign etiology for her sensory alterations on all 4 extremities.   MRI cervical 03/22/17:  IMPRESSION:  This MRI of the cervical spine with and without contrast shows the following: 1.   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 There is mild foraminal narrowing but no nerve root compression. 2.   At C4-C5 and C5-C6, there are mild disc degenerative changes that do not lead to significant spinal stenosis or any nerve root compression. 3.   The spinal cord appears normal. There is a normal enhancement pattern and there are no acute findings.

## 2017-04-02 ENCOUNTER — Encounter: Payer: Self-pay | Admitting: Family Medicine

## 2017-04-13 ENCOUNTER — Encounter: Payer: Self-pay | Admitting: Family Medicine

## 2017-04-13 DIAGNOSIS — K439 Ventral hernia without obstruction or gangrene: Secondary | ICD-10-CM

## 2017-05-25 ENCOUNTER — Other Ambulatory Visit: Payer: Self-pay | Admitting: General Surgery

## 2017-06-10 ENCOUNTER — Other Ambulatory Visit: Payer: Self-pay | Admitting: Family Medicine

## 2017-06-10 DIAGNOSIS — I10 Essential (primary) hypertension: Secondary | ICD-10-CM

## 2017-06-12 ENCOUNTER — Other Ambulatory Visit: Payer: Self-pay

## 2017-06-12 ENCOUNTER — Encounter: Payer: Self-pay | Admitting: Emergency Medicine

## 2017-06-12 ENCOUNTER — Ambulatory Visit (INDEPENDENT_AMBULATORY_CARE_PROVIDER_SITE_OTHER): Payer: BLUE CROSS/BLUE SHIELD | Admitting: Emergency Medicine

## 2017-06-12 VITALS — BP 140/80 | HR 91 | Temp 99.1°F | Resp 16 | Ht 65.0 in | Wt 224.4 lb

## 2017-06-12 DIAGNOSIS — L03114 Cellulitis of left upper limb: Secondary | ICD-10-CM

## 2017-06-12 DIAGNOSIS — W57XXXA Bitten or stung by nonvenomous insect and other nonvenomous arthropods, initial encounter: Secondary | ICD-10-CM

## 2017-06-12 MED ORDER — CEPHALEXIN 500 MG PO CAPS: 500.0000 mg | ORAL_CAPSULE | Freq: Three times a day (TID) | ORAL | 0 refills | Status: AC

## 2017-06-12 NOTE — Addendum Note (Signed)
Addended by: Davina Poke on: 06/12/2017 05:43 PM   Modules accepted: Orders

## 2017-06-12 NOTE — Patient Instructions (Addendum)
Continue Doxycycline and Bactroban. Start Keflex.    IF you received an x-ray today, you will receive an invoice from [facility]. Please contact [facility] at [phone] with questions or concerns regarding your invoice.   IF you received labwork today, you will receive an invoice from [facility]. Please contact [facility] at [phone] with questions or concerns regarding your invoice.   Our billing staff will not be able to assist you with questions regarding bills from these companies.  You will be contacted with the lab results as soon as they are available. The fastest way to get your results is to activate your My Chart account. Instructions are located on the last page of this paperwork. If you have not heard from [place] regarding the results in 2 weeks, please contact this office.     Cellulitis, Adult Cellulitis is a skin infection. The infected area is usually red and sore. This condition occurs most often in the arms and lower legs. It is very important to get treated for this condition. Follow these instructions at home:  Take over-the-counter and prescription medicines only as told by your doctor.  If you were prescribed an antibiotic medicine, take it as told by your doctor. Do not stop taking the antibiotic even if you start to feel better.  Drink enough fluid to keep your pee (urine) clear or pale yellow.  Do not touch or rub the infected area.  Raise (elevate) the infected area above the level of your heart while you are sitting or lying down.  Place warm or cold wet cloths (warm or cold compresses) on the infected area. Do this as told by your doctor.  Keep all follow-up visits as told by your doctor. This is important. These visits let your doctor make sure your infection is not getting worse. Contact a doctor if:  You have a fever.  Your symptoms do not get better after 1-2 days of treatment.  Your bone or joint under the infected area starts  to hurt after the skin has healed.  Your infection comes back. This can happen in the same area or another area.  You have a swollen bump in the infected area.  You have new symptoms.  You feel ill and also have muscle aches and pains. Get help right away if:  Your symptoms get worse.  You feel very sleepy.  You throw up (vomit) or have watery poop (diarrhea) for a long time.  There are red streaks coming from the infected area.  Your red area gets larger.  Your red area turns darker. This information is not intended to replace advice given to you by your health care provider. Make sure you discuss any questions you have with your health care provider. Document Released: 01/07/2008 Document Revised: 12/27/2015 Document Reviewed: 05/30/2015 Elsevier Interactive Patient Education  2018 [employer].

## 2017-06-12 NOTE — Progress Notes (Signed)
Bethany [name] 41 y.o.   Chief Complaint  Patient presents with  . Insect Bite    LEFT HAND -per patient was seen at [facility] 06/10/17    HISTORY OF PRESENT ILLNESS: This is a 41 y.o. female complaining of infected lesion, possible insect bite, on left hand; recently seen at clinic and started on Doxycycline and Bactroban.  HPI   Prior to Admission medications   Medication Sig Start Date End Date Taking? Authorizing Provider  amLODipine (NORVASC) 10 MG tablet Take 1 tablet (10 mg total) by mouth daily. 03/06/17  Yes Wendie Agreste, MD  DOXYCYCLINE HYCLATE PO Take 100 mg 2 (two) times daily by mouth.   Yes [provider]  Ferrous Sulfate (IRON SUPPLEMENT PO) Take by mouth continuous as needed.   Yes [provider]  losartan-hydrochlorothiazide (HYZAAR) 100-25 MG tablet Take 1 tablet by mouth daily. 03/06/17  Yes Wendie Agreste, MD  Mupirocin [facility] EX) Apply 3 (three) times daily topically.   Yes [provider]  Ferrous Gluconate-C-Folic Acid (IRON-C PO) Take 65 mg by mouth.    [provider]    No Known Allergies  Patient Active Problem List   Diagnosis Date Noted  . Paresthesia 03/10/2017  . 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 Coital headache 03/10/2017  . Anemia 07/10/2016  . Placental abruption 02/01/2015  . Severe preeclampsia 02/01/2015  . H/O cesarean section 01/29/2015  . Uterine fibroid   . Hypertension 06/23/2013  . PCOS (polycystic ovarian syndrome) 06/23/2013  . Palpitations 06/23/2013    Past Medical History:  Diagnosis Date  . Anemia   . Chronic hypertension during pregnancy, antepartum 02/01/2015  . Coital headache 03/10/2017  . GERD (gastroesophageal reflux disease)   . Hypertension   . Preeclampsia   . Uterine hemorrhage 02/01/2015    Past Surgical History:  Procedure Laterality Date  . HERNIA REPAIR      Social History   Socioeconomic History  . Marital status: Married   Spouse name: Corene Cornea  . Number of children: 3  . Years of education: Associates  . Highest education level: Not on file  Social Needs  . Financial resource strain: Not on file  . Food insecurity - worry: Not on file  . Food insecurity - inability: Not on file  . Transportation needs - medical: Not on file  . Transportation needs - non-medical: Not on file  Occupational History  . Occupation: Regulatory affairs officer  Tobacco Use  . Smoking status: Never Smoker  . Smokeless tobacco: Never Used  Substance and Sexual Activity  . Alcohol use: No  . Drug use: No  . Sexual activity: Yes    Birth control/protection: None  Other Topics Concern  . Not on file  Social History Narrative   Lives with husband and child   Caffeine use:  Coffee (2 cups per day or less)   Left handed    Family History  Problem Relation Age of Onset  . Hypertension Mother   . Cancer Father   . Diabetes Father   . Hypertension Father   . Heart attack Paternal Grandmother   . Heart attack Paternal Grandfather   . Diabetes Brother   . Kidney failure Brother      Review of Systems  Constitutional: Negative.  Negative for chills and fever.  HENT: Negative for sore throat.   Respiratory: Negative for shortness of breath.   Cardiovascular: Negative for chest pain.  Gastrointestinal: Negative for abdominal pain,  diarrhea, nausea and vomiting.  Genitourinary: Negative.   Skin: Positive for rash.  Endo/Heme/Allergies: Negative.   All other systems reviewed and are negative.  Vitals:   06/12/17 1329  BP: 140/80  Pulse: 91  Resp: 16  Temp: 99.1 F (37.3 C)  SpO2: 98%     Physical Exam  Constitutional: She is oriented to person, place, and time. She appears well-developed and well-nourished.  HENT:  Head: Normocephalic.  Eyes: Pupils are equal, round, and reactive to light.  Neck: Normal range of motion.  Cardiovascular: Normal rate.  Pulmonary/Chest: Effort normal.  Musculoskeletal: Normal range of  motion.  Neurological: She is alert and oriented to person, place, and time. No sensory deficit. She exhibits normal muscle tone.  Skin: Skin is warm and dry. Capillary refill takes less than 2 seconds.  Left wrist ulnar side: +blister x 2 with surrounding erythema; no lymphangitis.  Psychiatric: She has a normal mood and affect. Her behavior is normal.  Vitals reviewed.    ASSESSMENT & PLAN: Jourden was seen today for insect bite.  Diagnoses and all orders for this visit:  Cellulitis of left upper extremity  Bug bite with infection, initial encounter Comments: suspected  Other orders -     cephALEXin (KEFLEX) 500 MG capsule; Take 1 capsule (500 mg total) 3 (three) times daily for 7 days by mouth.    Patient Instructions   Continue Doxycycline and Bactroban. Start Keflex.    IF you received an x-ray today, you will receive an invoice from [facility]. Please contact [facility] at [phone] with questions or concerns regarding your invoice.   IF you received labwork today, you will receive an invoice from [place]. Please contact [facility] at [phone] with questions or concerns regarding your invoice.   Our billing staff will not be able to assist you with questions regarding bills from these companies.  You will be contacted with the lab results as soon as they are available. The fastest way to get your results is to activate your My Chart account. Instructions are located on the last page of this paperwork. If you have not heard from [place] regarding the results in 2 weeks, please contact this office.     Cellulitis, Adult Cellulitis is a skin infection. The infected area is usually red and sore. This condition occurs most often in the arms and lower legs. It is very important to get treated for this condition. Follow these instructions at home:  Take over-the-counter and prescription medicines only as told by your doctor.  If you were prescribed an  antibiotic medicine, take it as told by your doctor. Do not stop taking the antibiotic even if you start to feel better.  Drink enough fluid to keep your pee (urine) clear or pale yellow.  Do not touch or rub the infected area.  Raise (elevate) the infected area above the level of your heart while you are sitting or lying down.  Place warm or cold wet cloths (warm or cold compresses) on the infected area. Do this as told by your doctor.  Keep all follow-up visits as told by your doctor. This is important. These visits let your doctor make sure your infection is not getting worse. Contact a doctor if:  You have a fever.  Your symptoms do not get better after 1-2 days of treatment.  Your bone or joint under the infected area starts to hurt after the skin has healed.  Your infection comes back. This can happen in the  same area or another area.  You have a swollen bump in the infected area.  You have new symptoms.  You feel ill and also have muscle aches and pains. Get help right away if:  Your symptoms get worse.  You feel very sleepy.  You throw up (vomit) or have watery poop (diarrhea) for a long time.  There are red streaks coming from the infected area.  Your red area gets larger.  Your red area turns darker. This information is not intended to replace advice given to you by your health care provider. Make sure you discuss any questions you have with your health care provider. Document Released: 01/07/2008 Document Revised: 12/27/2015 Document Reviewed: 05/30/2015 Elsevier Interactive Patient Education  2018 Elsevier Inc.      Agustina Caroli, MD [facility]

## 2017-06-15 LAB — WOUND CULTURE: ORGANISM ID, BACTERIA: NONE SEEN

## 2017-06-16 ENCOUNTER — Encounter: Payer: Self-pay | Admitting: Radiology

## 2017-06-17 ENCOUNTER — Ambulatory Visit: Payer: Self-pay | Admitting: *Deleted

## 2017-06-17 NOTE — Telephone Encounter (Signed)
Patient had been treated for possible MRSA, and had taken antibiotics. Arm is somewhat better but now the swelling has increased with a "pus filled" area.  Care advice given to patient with verbal understanding.  Reason for Disposition . [1] Taking antibiotic > 72 hours (3 days) AND [2] infected wound not improved (i.e., pain, pus, redness)  Answer Assessment - Initial Assessment Questions 1. ONSET: "When did the pain start?"     Monday last week 2. LOCATION: "Where is the pain located?"     Left arm 3. PAIN: "How bad is the pain?" (Scale 1-10; or mild, moderate, severe)   - MILD (1-3): doesn't interfere with normal activities   - MODERATE (4-7): interferes with normal activities (e.g., work or school) or awakens from sleep   - SEVERE (8-10): excruciating pain, unable to do any normal activities, unable to hold a cup of water     4 4. WORK OR EXERCISE: "Has there been any recent work or exercise that involved this part of the body?"     Uses computer 5. CAUSE: "What do you think is causing the arm pain?"     Not sure 6. OTHER SYMPTOMS: "Do you have any other symptoms?" (e.g., neck pain, swelling, rash, fever, numbness, weakness)    no 7. PREGNANCY: "Is there any chance you are pregnant?" "When was your last menstrual period?"     no  Protocols used: WOUND INFECTION-A-AH, ARM PAIN-A-AH

## 2017-06-18 ENCOUNTER — Ambulatory Visit: Payer: Self-pay | Admitting: Family Medicine

## 2017-06-18 ENCOUNTER — Telehealth: Payer: Self-pay | Admitting: Family Medicine

## 2017-06-18 NOTE — Telephone Encounter (Signed)
Pt. called to report improvement in symptoms of left wrist infection.  Reported the blister was nickel-sized yesterday, and is about dime-sized today, and appears to be drying up.  Also stated that the pain has decreased to a 1/10.  Denied fever/ chills.  Stated she continues to take her antibiotics, as prescribed.  Questioned if the nurse feels she needs to keep her appt. today at 5:20 PM?  Advised that with improvement in symptoms, it would be appropriate to cancel the appt. today.  Stated she stopped using her Bactroban yesterday, because she wanted to allow the area to dry up.  Encouraged to wash area with an antibacterial soap.  Also suggested she could apply warm pack to the wrist area intermittently for 10 min. intervals. Encouraged to call back with any concerns or worsening symptoms. Confirmed with pt. that appt. today will be cancelled per her request.  Verb. Understanding/agreed.

## 2017-07-09 ENCOUNTER — Telehealth: Payer: Self-pay | Admitting: Family Medicine

## 2017-07-09 ENCOUNTER — Encounter: Payer: Self-pay | Admitting: *Deleted

## 2017-07-09 NOTE — Telephone Encounter (Signed)
This encounter was created in error - please disregard.

## 2017-07-09 NOTE — Telephone Encounter (Signed)
Pt called regarding whether to take an antibiotic that was given to her for a skin infection  that she had previously. Pt stated there was an area on her arm that had become infected a few months ago and she had use Mupirocin and taken 2 different antibiotics, which finally it cleared up.  She went on to say that the area was looking better this afternoon now, the redness and itchiness had decreased. She said she had been using the Mupirocin on it and she felt like that was helping. So holding off on the antibiotic for now.  Advised to call back if the redness or itching increase or the area starts to look worst. Advised against taking the antibiotic until checking with her provider. And also if she starts running a fever. Pt voiced understanding.

## 2017-07-10 NOTE — Telephone Encounter (Signed)
Please advise 

## 2017-07-11 NOTE — Telephone Encounter (Signed)
If infection appears to be improving, okay to continue the mupirocin. If any worsening or increased size of rash would recommend in office evaluation to determine next step.

## 2017-07-14 ENCOUNTER — Ambulatory Visit: Payer: BLUE CROSS/BLUE SHIELD | Admitting: Neurology

## 2017-07-21 ENCOUNTER — Encounter: Payer: Self-pay | Admitting: Emergency Medicine

## 2017-07-21 ENCOUNTER — Other Ambulatory Visit: Payer: Self-pay

## 2017-07-21 ENCOUNTER — Ambulatory Visit (INDEPENDENT_AMBULATORY_CARE_PROVIDER_SITE_OTHER): Payer: BLUE CROSS/BLUE SHIELD | Admitting: Emergency Medicine

## 2017-07-21 VITALS — BP 128/66 | HR 95 | Temp 98.7°F | Resp 16 | Ht 65.5 in | Wt 229.2 lb

## 2017-07-21 DIAGNOSIS — R21 Rash and other nonspecific skin eruption: Secondary | ICD-10-CM | POA: Diagnosis not present

## 2017-07-21 MED ORDER — MUPIROCIN CALCIUM 2 % EX CREA: 1.0000 "application " | TOPICAL_CREAM | Freq: Two times a day (BID) | CUTANEOUS | 0 refills | Status: DC

## 2017-07-21 NOTE — Patient Instructions (Addendum)
  Continue using Bactroban twice daily x 7 days. Keep it covered. Return if worse.    IF you received an x-ray today, you will receive an invoice from [facility]. Please contact [facility] at [phone] with questions or concerns regarding your invoice.   IF you received labwork today, you will receive an invoice from [place]. Please contact [facility] at [phone] with questions or concerns regarding your invoice.   Our billing staff will not be able to assist you with questions regarding bills from these companies.  You will be contacted with the lab results as soon as they are available. The fastest way to get your results is to activate your My Chart account. Instructions are located on the last page of this paperwork. If you have not heard from [place] regarding the results in 2 weeks, please contact this office.

## 2017-07-21 NOTE — Progress Notes (Signed)
Bethany [name] 41 y.o.   Chief Complaint  Patient presents with  . Rash    LEFT HAND x 07/20/17    HISTORY OF PRESENT ILLNESS: This is a 41 y.o. female complaining of rash to left hand since yesterday. Seen by me for same 06/12/17 and started on Doxycycline with results; developed blister that was opened somewhere else; continued using Bactroban with results; better but still somewhat present. No new symptoms.  HPI   Prior to Admission medications   Medication Sig Start Date End Date Taking? Authorizing Provider  amLODipine (NORVASC) 10 MG tablet Take 1 tablet (10 mg total) by mouth daily. 03/06/17  Yes Wendie Agreste, MD  Ferrous Sulfate (IRON SUPPLEMENT PO) Take by mouth continuous as needed.   Yes [provider]  losartan-hydrochlorothiazide (HYZAAR) 100-25 MG tablet Take 1 tablet by mouth daily. 03/06/17  Yes Wendie Agreste, MD  DOXYCYCLINE HYCLATE PO Take 100 mg 2 (two) times daily by mouth.    [provider]  Ferrous Gluconate-C-Folic Acid (IRON-C PO) Take 65 mg by mouth.    [provider]  Mupirocin (BACTROBAN EX) Apply 3 (three) times daily topically.    [provider]    No Known Allergies  Patient Active Problem List   Diagnosis Date Noted  . 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 Coital headache 03/10/2017  . Anemia 07/10/2016  . Uterine fibroid   . Hypertension 06/23/2013  . PCOS (polycystic ovarian syndrome) 06/23/2013    Past Medical History:  Diagnosis Date  . Anemia   . Chronic hypertension during pregnancy, antepartum 02/01/2015  . Coital headache 03/10/2017  . GERD (gastroesophageal reflux disease)   . Hypertension   . Preeclampsia   . Uterine hemorrhage 02/01/2015    Past Surgical History:  Procedure Laterality Date  . CESAREAN SECTION N/A 01/29/2015   Procedure: CESAREAN SECTION;  Surgeon: Newton Pigg, MD;  Location: [facility];  Service: Obstetrics;  Laterality: N/A;  . HERNIA REPAIR       Social History   Socioeconomic History  . Marital status: Married    Spouse name: Corene Cornea  . Number of children: 3  . Years of education: Associates  . Highest education level: Not on file  Social Needs  . Financial resource strain: Not on file  . Food insecurity - worry: Not on file  . Food insecurity - inability: Not on file  . Transportation needs - medical: Not on file  . Transportation needs - non-medical: Not on file  Occupational History  . Occupation: Regulatory affairs officer  Tobacco Use  . Smoking status: Never Smoker  . Smokeless tobacco: Never Used  Substance and Sexual Activity  . Alcohol use: No  . Drug use: No  . Sexual activity: Yes    Birth control/protection: None  Other Topics Concern  . Not on file  Social History Narrative   Lives with husband and child   Caffeine use:  Coffee (2 cups per day or less)   Left handed    Family History  Problem Relation Age of Onset  . Hypertension Mother   . Cancer Father   . Diabetes Father   . Hypertension Father   . Heart attack Paternal Grandmother   . Heart attack Paternal Grandfather   . Diabetes Brother   . Kidney failure Brother      Review of Systems  Constitutional: Negative for chills and fever.  HENT: Negative.   Eyes: Negative.   Respiratory: Negative.  Cardiovascular: Negative.   Gastrointestinal: Negative for abdominal pain, diarrhea, nausea and vomiting.  Skin: Positive for rash.  Neurological: Negative.   Endo/Heme/Allergies: Negative.   All other systems reviewed and are negative.    Physical Exam  Constitutional: She is oriented to person, place, and time. She appears well-developed and well-nourished.  HENT:  Head: Normocephalic and atraumatic.  Eyes: Pupils are equal, round, and reactive to light.  Neck: Normal range of motion.  Cardiovascular: Normal rate and regular rhythm.  Pulmonary/Chest: Effort normal.  Neurological: She is alert and oriented to person, place, and time.   Skin: Skin is warm and dry. Capillary refill takes less than 2 seconds.  Left wrist, ulnar side: dry, hyperpigmented circular rash 3 cm diameter.  Psychiatric: She has a normal mood and affect. Her behavior is normal.  Vitals reviewed.    ASSESSMENT & PLAN: Neysa was seen today for rash.  Diagnoses and all orders for this visit:  Rash and nonspecific skin eruption -     mupirocin cream (BACTROBAN) 2 %; Apply 1 application topically 2 (two) times daily.   Patient Instructions    Continue using Bactroban twice daily x 7 days. Keep it covered. Return if worse.    IF you received an x-ray today, you will receive an invoice from [facility]. Please contact [facility] at [phone] with questions or concerns regarding your invoice.   IF you received labwork today, you will receive an invoice from [place]. Please contact [facility] at [phone] with questions or concerns regarding your invoice.   Our billing staff will not be able to assist you with questions regarding bills from these companies.  You will be contacted with the lab results as soon as they are available. The fastest way to get your results is to activate your My Chart account. Instructions are located on the last page of this paperwork. If you have not heard from [place] regarding the results in 2 weeks, please contact this office.         Agustina Caroli, MD [facility]

## 2017-09-02 ENCOUNTER — Encounter: Payer: Self-pay | Admitting: Family Medicine

## 2017-09-03 ENCOUNTER — Ambulatory Visit: Payer: BLUE CROSS/BLUE SHIELD | Admitting: Family Medicine

## 2017-09-04 ENCOUNTER — Other Ambulatory Visit: Payer: Self-pay | Admitting: Family Medicine

## 2017-09-04 DIAGNOSIS — I10 Essential (primary) hypertension: Secondary | ICD-10-CM

## 2017-10-22 ENCOUNTER — Ambulatory Visit (INDEPENDENT_AMBULATORY_CARE_PROVIDER_SITE_OTHER): Payer: BLUE CROSS/BLUE SHIELD | Admitting: Family Medicine

## 2017-10-22 ENCOUNTER — Encounter: Payer: Self-pay | Admitting: Family Medicine

## 2017-10-22 ENCOUNTER — Other Ambulatory Visit: Payer: Self-pay

## 2017-10-22 VITALS — BP 136/80 | HR 69 | Temp 98.3°F | Ht 67.0 in | Wt 233.7 lb

## 2017-10-22 DIAGNOSIS — Z124 Encounter for screening for malignant neoplasm of cervix: Secondary | ICD-10-CM | POA: Diagnosis not present

## 2017-10-22 DIAGNOSIS — Z0000 Encounter for general adult medical examination without abnormal findings: Secondary | ICD-10-CM | POA: Diagnosis not present

## 2017-10-22 DIAGNOSIS — J309 Allergic rhinitis, unspecified: Secondary | ICD-10-CM | POA: Diagnosis not present

## 2017-10-22 DIAGNOSIS — I10 Essential (primary) hypertension: Secondary | ICD-10-CM

## 2017-10-22 LAB — LIPID PANEL
CHOL/HDL RATIO: 3 ratio (ref 0.0–4.4)
Cholesterol, Total: 176 mg/dL (ref 100–199)
HDL: 59 mg/dL (ref >39)
LDL Calculated: 108 mg/dL — ABNORMAL HIGH (ref 0–99)
TRIGLYCERIDES: 44 mg/dL (ref 0–149)
VLDL CHOLESTEROL CAL: 9 mg/dL (ref 5–40)

## 2017-10-22 LAB — COMPREHENSIVE METABOLIC PANEL
A/G RATIO: 1.6 (ref 1.2–2.2)
ALT: 16 IU/L (ref 0–32)
AST: 16 IU/L (ref 0–40)
Albumin: 4.7 g/dL (ref 3.5–5.5)
Alkaline Phosphatase: 78 IU/L (ref 39–117)
BUN/Creatinine Ratio: 20 (ref 9–23)
BUN: 16 mg/dL (ref 6–24)
Bilirubin Total: 0.3 mg/dL (ref 0.0–1.2)
CALCIUM: 9.4 mg/dL (ref 8.7–10.2)
CO2: 23 mmol/L (ref 20–29)
Chloride: 102 mmol/L (ref 96–106)
Creatinine, Ser: 0.82 mg/dL (ref 0.57–1.00)
GFR calc non Af Amer: 90 mL/min/{1.73_m2} (ref >59)
GFR, EST AFRICAN AMERICAN: 104 mL/min/{1.73_m2} (ref >59)
GLUCOSE: 90 mg/dL (ref 65–99)
Globulin, Total: 2.9 g/dL (ref 1.5–4.5)
POTASSIUM: 3.9 mmol/L (ref 3.5–5.2)
Sodium: 139 mmol/L (ref 134–144)
TOTAL PROTEIN: 7.6 g/dL (ref 6.0–8.5)

## 2017-10-22 MED ORDER — AMLODIPINE BESYLATE 10 MG PO TABS: 10.0000 mg | ORAL_TABLET | Freq: Every day | ORAL | 1 refills | Status: DC

## 2017-10-22 MED ORDER — LOSARTAN POTASSIUM-HCTZ 100-25 MG PO TABS: 1.0000 | ORAL_TABLET | Freq: Every day | ORAL | 1 refills | Status: DC

## 2017-10-22 NOTE — Progress Notes (Addendum)
Subjective:  By signing my name below, I, [name], attest that this documentation has been prepared under the direction and in the presence of Merri Ray, MD. Electronically Signed: [name], [place]. 10/22/2017 , 9:30 AM .  Patient was seen in Room 12 .   Patient ID: Bethany [name], female    DOB: 12/13/1975, 42 y.o.   MRN: [id number] Chief Complaint  Patient presents with   CPE   HPI Bethany [name] is a 42 y.o. female Here for annual physical. She has a history of PCOS, HTN and abnormal brain MRI.   ROS complaints She complains of facial swelling, eye swelling, eye itching and seasonal allergies. She states having facial swelling in her cheek bones, along with eye swelling and itching under her right eye. She notes usually having these symptoms with seasonal allergies. She takes OTC Claritin and eye drops. She believes she's taken Flonase in the past.   HTN She takes amlodipine 10mg  and Losartan-HCTZ 100-25mg . She denies side effects with medications.   Lab Results  Component Value Date   CREATININE 0.73 03/06/2017    History of abnormal brain MRI with prior postcoital headaches She had some bifrontal white matter abnormal on MRI of brain; thought to be due to previous HTN. MRI of cervical spine without demyelinating lesions, seen by Dr. Jannifer Franklin in Aug 2018. Plan for 4 month follow up.   She hasn't gone back to Dr. Jannifer Franklin for follow up. She denies any continued numbness or tingling; believes it was more due to stress.   Cancer Screening Pap testing: last done in July 2016; has OBGYN but hasn't been seen in a while due to busy schedule. Updated today.   STI screening She declines need of STI screening.   Falls screening She denies any falls in past year.   Immunizations There is no immunization history for the selected administration types on file for this patient.   Depression Depression screen [facility] 2/9 10/22/2017 06/12/2017 03/06/2017 12/30/2016 11/25/2016    Decreased Interest 0 0 0 0 0  Down, Depressed, Hopeless 0 0 0 0 0  PHQ - 2 Score 0 0 0 0 0    Vision  Visual Acuity Screening   Right eye Left eye Both eyes  Without correction: 20/20 20/20 20/20   With correction:       Dentist She is followed by a dentist, seen about every 6 months.   Exercise She states she started walking in the mornings about 2 months ago, about 4 days a week. She's been doing intermittent fasting.   Wt Readings from Last 3 Encounters:  10/22/17 233 lb 11.2 oz (106 kg)  07/21/17 229 lb 3.2 oz (104 kg)  06/12/17 224 lb 6.4 oz (101.8 kg)    Patient Active Problem List   Diagnosis Date Noted   White matter abnormality on MRI of brain 03/10/2017   Coital headache 03/10/2017   Anemia 07/10/2016   Uterine fibroid    Hypertension 06/23/2013   PCOS (polycystic ovarian syndrome) 06/23/2013   Past Medical History:  Diagnosis Date   Anemia    Chronic hypertension during pregnancy, antepartum 02/01/2015   Coital headache 03/10/2017   GERD (gastroesophageal reflux disease)    Hypertension    Preeclampsia    Uterine hemorrhage 02/01/2015   Past Surgical History:  Procedure Laterality Date   CESAREAN SECTION N/A 01/29/2015   Procedure: CESAREAN SECTION;  Surgeon: Newton Pigg, MD;  Location: [facility];  Service: Obstetrics;  Laterality: N/A;  HERNIA REPAIR     No Known Allergies Prior to Admission medications   Medication Sig Start Date End Date Taking? Authorizing Provider  amLODipine (NORVASC) 10 MG tablet TAKE 1 TABLET BY MOUTH EVERY DAY 09/04/17   Wendie Agreste, MD  DOXYCYCLINE HYCLATE PO Take 100 mg 2 (two) times daily by mouth.    [provider]  Ferrous Gluconate-C-Folic Acid (IRON-C PO) Take 65 mg by mouth.    [provider]  Ferrous Sulfate (IRON SUPPLEMENT PO) Take by mouth continuous as needed.    [provider]  losartan-hydrochlorothiazide (HYZAAR) 100-25 MG tablet TAKE 1 TABLET BY MOUTH EVERY DAY  09/04/17   Wendie Agreste, MD  Mupirocin [facility] EX) Apply 3 (three) times daily topically.    [provider]  mupirocin cream (BACTROBAN) 2 % Apply 1 application topically 2 (two) times daily. 07/21/17   Horald Pollen, MD   Social History   Socioeconomic History   Marital status: Married    Spouse name: Corene Cornea   Number of children: 3   Years of education: Associates   Highest education level: Not on file  Occupational History   Occupation: Producer, television/film/video strain: Not on file   Food insecurity:    Worry: Not on file    Inability: Not on file   Transportation needs:    Medical: Not on file    Non-medical: Not on file  Tobacco Use   Smoking status: Never Smoker   Smokeless tobacco: Never Used  Substance and Sexual Activity   Alcohol use: No   Drug use: No   Sexual activity: Yes    Birth control/protection: None  Lifestyle   Physical activity:    Days per week: Not on file    Minutes per session: Not on file   Stress: Not on file  Relationships   Social connections:    Talks on phone: Not on file    Gets together: Not on file    Attends religious service: Not on file    Active member of club or organization: Not on file    Attends meetings of clubs or organizations: Not on file    Relationship status: Not on file   Intimate partner violence:    Fear of current or ex partner: Not on file    Emotionally abused: Not on file    Physically abused: Not on file    Forced sexual activity: Not on file  Other Topics Concern   Not on file  Social History Narrative   Lives with husband and child   Caffeine use:  Coffee (2 cups per day or less)   Left handed   Review of Systems  HENT: Positive for facial swelling.   Eyes: Positive for itching.  Allergic/Immunologic: Positive for environmental allergies.   13 point ROS - positive for facial swelling, eye swelling, eye itching and seasonal allergies      Objective:   Physical Exam  Constitutional: She is oriented to person, place, and time. She appears well-developed and well-nourished.  HENT:  Head: Normocephalic and atraumatic.  Right Ear: External ear normal.  Left Ear: External ear normal.  Mouth/Throat: Oropharynx is clear and moist.  Minimal fullness below the eyelids  Eyes: Pupils are equal, round, and reactive to light. Conjunctivae are normal.  Neck: Normal range of motion. Neck supple. No thyromegaly present.  Cardiovascular: Normal rate, regular rhythm, normal heart sounds and intact distal pulses.  No murmur  heard. Pulmonary/Chest: Effort normal and breath sounds normal. No respiratory distress. She has no wheezes.  Abdominal: Soft. Bowel sounds are normal. There is no tenderness.  Genitourinary: Vagina normal and uterus normal. There is no rash, tenderness or lesion on the right labia. There is no rash, tenderness or lesion on the left labia. Cervix exhibits friability (slight bleeding with pap. ). Right adnexum displays no mass and no tenderness. Left adnexum displays no mass and no tenderness.  Musculoskeletal: Normal range of motion. She exhibits no edema or tenderness.  Lymphadenopathy:    She has no cervical adenopathy.  Neurological: She is alert and oriented to person, place, and time.  Skin: Skin is warm and dry. No rash noted.  Psychiatric: She has a normal mood and affect. Her behavior is normal. Thought content normal.  Vitals reviewed.    Vitals:   10/22/17 0907  BP: 136/80  Pulse: 69  Temp: 98.3 F (36.8 C)  TempSrc: Oral  SpO2: 99%  Weight: 233 lb 11.2 oz (106 kg)  Height: 5\' 7"  (1.702 m)       Assessment & Plan:   SHAUNAE SIELOFF is a 42 y.o. female Annual physical exam  - -anticipatory guidance as below in AVS, screening labs above. Health maintenance items as above in HPI discussed/recommended as applicable.   Allergic rhinitis, unspecified seasonality, unspecified trigger  -Trigger  avoidance, over-the-counter medication options discussed including adding Flonase to Claritin.  RTC precautions  Essential hypertension - Plan: Comprehensive metabolic panel, Lipid panel, losartan-hydrochlorothiazide (HYZAAR) 100-25 MG tablet, amLODipine (NORVASC) 10 MG tablet  -Stable, continue same regimen, labs pending  Screening for cervical cancer - Plan: Pap IG w/ reflex to HPV when ASC-U  -Pap obtained, some bleeding -intermittent with history of PCOS..  Advised if insufficient testing, may need to repeat.     Meds ordered this encounter  Medications   losartan-hydrochlorothiazide (HYZAAR) 100-25 MG tablet    Sig: Take 1 tablet by mouth daily.    Dispense:  90 tablet    Refill:  1   amLODipine (NORVASC) 10 MG tablet    Sig: Take 1 tablet (10 mg total) by mouth daily.    Dispense:  90 tablet    Refill:  1   Patient Instructions   For the eye symptoms and fullness below the eyes - continue claritin, try adding flonase nasal spray daily to see if that helps your symptoms. If continued symptoms - return to discuss other options.   I would recommend meeting back with neurologist to determine if repeat testing indicated or other workup needed.   No change in meds for now. Thank you for coming in today.   Keeping You Healthy  Get These Tests 1. Blood Pressure- Have your blood pressure checked once a year by your health care provider.  Normal blood pressure is 120/80. 2. Weight- Have your body mass index (BMI) calculated to screen for obesity.  BMI is measure of body fat based on height and weight.  You can also calculate your own BMI at GravelBags.it. 3. Cholesterol- Have your cholesterol checked every 5 years starting at age 32 then yearly starting at age 66. 45. Chlamydia, HIV, and other sexually transmitted diseases- Get screened every year until age 7, then within three months of each new sexual provider. 5. Pap Test - Every 1-5 years; discuss with your health  care provider. 6. Mammogram- Every 1-2 years starting at age 75--50  Take these medicines  Calcium with Vitamin D-Your body needs 1200  mg of Calcium each day and [phone] IU of Vitamin D daily.  Your body can only absorb 500 mg of Calcium at a time so Calcium must be taken in 2 or 3 divided doses throughout the day.  Multivitamin with folic acid- Once daily if it is possible for you to become pregnant.  Get these Immunizations  Gardasil-Series of three doses; prevents HPV related illness such as genital warts and cervical cancer.  Menactra-Single dose; prevents meningitis.  Tetanus shot- Every 10 years.  Flu shot-Every year.  Take these steps 1. Do not smoke-Your healthcare provider can help you quit.  For tips on how to quit go to www.smokefree.gov or call 1-800 QUITNOW. 2. Be physically active- Exercise 5 days a week for at least 30 minutes.  If you are not already physically active, start slow and gradually work up to 30 minutes of moderate physical activity.  Examples of moderate activity include walking briskly, dancing, swimming, bicycling, etc. 3. Breast Cancer- A self breast exam every month is important for early detection of breast cancer.  For more information and instruction on self breast exams, ask your healthcare provider or https://www.patel.info/. 4. Eat a healthy diet- Eat a variety of healthy foods such as fruits, vegetables, whole grains, low fat milk, low fat cheeses, yogurt, lean meats, poultry and fish, beans, nuts, tofu, etc.  For more information go to www. Thenutritionsource.org 5. Drink alcohol in moderation- Limit alcohol intake to one drink or less per day. Never drink and drive. 6. Depression- Your emotional health is as important as your physical health.  If you're feeling down or losing interest in things you normally enjoy please talk to your healthcare provider about being screened for depression. 7. Dental visit- Brush and floss your  teeth twice daily; visit your dentist twice a year. 8. Eye doctor- Get an eye exam at least every 2 years. 9. Helmet use- Always wear a helmet when riding a bicycle, motorcycle, rollerblading or skateboarding. 79. Safe sex- If you may be exposed to sexually transmitted infections, use a condom. 11. Seat belts- Seat belts can save your live; always wear one. 12. Smoke/Carbon Monoxide detectors- These detectors need to be installed on the appropriate level of your home. Replace batteries at least once a year. 13. Skin cancer- When out in the sun please cover up and use sunscreen 15 SPF or higher. 14. Violence- If anyone is threatening or hurting you, please tell your healthcare provider.         IF you received an x-ray today, you will receive an invoice from [facility]. Please contact [facility] at [phone] with questions or concerns regarding your invoice.   IF you received labwork today, you will receive an invoice from [facility]. Please contact [facility] at [phone] with questions or concerns regarding your invoice.   Our billing staff will not be able to assist you with questions regarding bills from these companies.  You will be contacted with the lab results as soon as they are available. The fastest way to get your results is to activate your My Chart account. Instructions are located on the last page of this paperwork. If you have not heard from [place] regarding the results in 2 weeks, please contact this office.        I personally performed the services described in this documentation, which was scribed in my presence. The recorded information has been reviewed and considered for accuracy and completeness, addended by me as needed, and agree with information  above.  Signed,   Merri Ray, MD [facility] at [place].  10/22/17 9:55 AM

## 2017-10-22 NOTE — Patient Instructions (Addendum)
For the eye symptoms and fullness below the eyes - continue claritin, try adding flonase nasal spray daily to see if that helps your symptoms. If continued symptoms - return to discuss other options.   I would recommend meeting back with neurologist to determine if repeat testing indicated or other workup needed.   No change in meds for now. Thank you for coming in today.   Keeping You Healthy  Get These Tests 1. Blood Pressure- Have your blood pressure checked once a year by your health care provider.  Normal blood pressure is 120/80. 2. Weight- Have your body mass index (BMI) calculated to screen for obesity.  BMI is measure of body fat based on height and weight.  You can also calculate your own BMI at GravelBags.it. 3. Cholesterol- Have your cholesterol checked every 5 years starting at age 23 then yearly starting at age 41. 103. Chlamydia, HIV, and other sexually transmitted diseases- Get screened every year until age 15, then within three months of each new sexual provider. 5. Pap Test - Every 1-5 years; discuss with your health care provider. 6. Mammogram- Every 1-2 years starting at age 61--50  Take these medicines  Calcium with Vitamin D-Your body needs 1200 mg of Calcium each day and [phone] IU of Vitamin D daily.  Your body can only absorb 500 mg of Calcium at a time so Calcium must be taken in 2 or 3 divided doses throughout the day.  Multivitamin with folic acid- Once daily if it is possible for you to become pregnant.  Get these Immunizations  Gardasil-Series of three doses; prevents HPV related illness such as genital warts and cervical cancer.  Menactra-Single dose; prevents meningitis.  Tetanus shot- Every 10 years.  Flu shot-Every year.  Take these steps 1. Do not smoke-Your healthcare provider can help you quit.  For tips on how to quit go to www.smokefree.gov or call 1-800 QUITNOW. 2. Be physically active- Exercise 5 days a week for at least 30  minutes.  If you are not already physically active, start slow and gradually work up to 30 minutes of moderate physical activity.  Examples of moderate activity include walking briskly, dancing, swimming, bicycling, etc. 3. Breast Cancer- A self breast exam every month is important for early detection of breast cancer.  For more information and instruction on self breast exams, ask your healthcare provider or https://www.patel.info/. 4. Eat a healthy diet- Eat a variety of healthy foods such as fruits, vegetables, whole grains, low fat milk, low fat cheeses, yogurt, lean meats, poultry and fish, beans, nuts, tofu, etc.  For more information go to www. Thenutritionsource.org 5. Drink alcohol in moderation- Limit alcohol intake to one drink or less per day. Never drink and drive. 6. Depression- Your emotional health is as important as your physical health.  If you're feeling down or losing interest in things you normally enjoy please talk to your healthcare provider about being screened for depression. 7. Dental visit- Brush and floss your teeth twice daily; visit your dentist twice a year. 8. Eye doctor- Get an eye exam at least every 2 years. 9. Helmet use- Always wear a helmet when riding a bicycle, motorcycle, rollerblading or skateboarding. 54. Safe sex- If you may be exposed to sexually transmitted infections, use a condom. 11. Seat belts- Seat belts can save your live; always wear one. 12. Smoke/Carbon Monoxide detectors- These detectors need to be installed on the appropriate level of your home. Replace batteries at least once a year. 13.  Skin cancer- When out in the sun please cover up and use sunscreen 15 SPF or higher. 14. Violence- If anyone is threatening or hurting you, please tell your healthcare provider.         IF you received an x-ray today, you will receive an invoice from [facility]. Please contact [facility] at [phone] with  questions or concerns regarding your invoice.   IF you received labwork today, you will receive an invoice from [facility]. Please contact [facility] at [phone] with questions or concerns regarding your invoice.   Our billing staff will not be able to assist you with questions regarding bills from these companies.  You will be contacted with the lab results as soon as they are available. The fastest way to get your results is to activate your My Chart account. Instructions are located on the last page of this paperwork. If you have not heard from [place] regarding the results in 2 weeks, please contact this office.

## 2017-10-24 ENCOUNTER — Encounter: Payer: Self-pay | Admitting: Family Medicine

## 2017-10-26 LAB — PAP IG W/ RFLX HPV ASCU: PAP SMEAR COMMENT: 0

## 2018-02-19 LAB — HM MAMMOGRAPHY

## 2018-03-04 ENCOUNTER — Encounter: Payer: Self-pay | Admitting: *Deleted

## 2018-04-01 ENCOUNTER — Encounter: Payer: Self-pay | Admitting: Family Medicine

## 2018-04-01 ENCOUNTER — Other Ambulatory Visit: Payer: Self-pay

## 2018-04-01 DIAGNOSIS — K439 Ventral hernia without obstruction or gangrene: Secondary | ICD-10-CM

## 2018-04-15 ENCOUNTER — Telehealth: Payer: Self-pay | Admitting: Family Medicine

## 2018-04-15 NOTE — Telephone Encounter (Signed)
Copied from [place]. Topic: General - Other >> Apr 15, 2018  9:40 AM Carolyn Stare wrote:  Dr Cheryln Manly is requesting that pt have a scan done before the surgery would like a call back

## 2018-04-20 NOTE — Telephone Encounter (Signed)
More info please - what scan?  I assume she is indicating CT of abdomen for ventral hernia, but please verify.

## 2018-04-23 ENCOUNTER — Telehealth: Payer: Self-pay | Admitting: Family Medicine

## 2018-04-23 ENCOUNTER — Other Ambulatory Visit: Payer: Self-pay | Admitting: *Deleted

## 2018-04-23 DIAGNOSIS — K439 Ventral hernia without obstruction or gangrene: Secondary | ICD-10-CM

## 2018-04-23 NOTE — Telephone Encounter (Signed)
PATIENT INSURANCE DENIED PER [employer] PER MEDICALLY NECCESSITY  PLEASE ADVISE

## 2018-04-24 NOTE — Telephone Encounter (Signed)
Bethany [name] was looking into this. We can order CT abdomen without contrast or possible abdominal ultrasound if that is covered AND sufficient for treating surgeon for eval of possible hernia. Thanks.

## 2018-04-26 NOTE — Telephone Encounter (Signed)
Peer to peer with Almyra Free approved [id number] ref#  vaild 04/2018 thru 05/23/19 Given to [facility] in referrals.

## 2018-04-27 ENCOUNTER — Other Ambulatory Visit: Payer: Self-pay | Admitting: Family Medicine

## 2018-04-27 DIAGNOSIS — I10 Essential (primary) hypertension: Secondary | ICD-10-CM

## 2018-05-03 ENCOUNTER — Ambulatory Visit
Admission: RE | Admit: 2018-05-03 | Discharge: 2018-05-03 | Disposition: A | Discharge status: Home / Self Care | Payer: BLUE CROSS/BLUE SHIELD | Source: Ambulatory Visit | Attending: Pediatrics | Admitting: Pediatrics

## 2018-05-03 DIAGNOSIS — K439 Ventral hernia without obstruction or gangrene: Secondary | ICD-10-CM

## 2018-05-08 ENCOUNTER — Other Ambulatory Visit: Payer: Self-pay | Admitting: Family Medicine

## 2018-05-08 DIAGNOSIS — I10 Essential (primary) hypertension: Secondary | ICD-10-CM

## 2018-05-10 NOTE — Telephone Encounter (Signed)
Requested medication (s) are due for refill today: yes  Requested medication (s) are on the active medication list: yes  Last refill:  10/22/17  Future visit scheduled: no  Notes to clinic:  Last visit was in March 2019.  Pharmacy is requesting a DX code.   Requested Prescriptions  Pending Prescriptions Disp Refills   amLODipine (NORVASC) 10 MG tablet [Pharmacy Med Name: AMLODIPINE BESYLATE 10 MG TAB] 90 tablet 1    Sig: TAKE 1 TABLET BY MOUTH EVERY DAY     Cardiovascular:  Calcium Channel Blockers Failed - 05/08/2018 12:41 PM      Failed - Valid encounter within last 6 months    Recent Outpatient Visits          6 months ago Annual physical exam   [facility] [place], Ranell Patrick, MD   9 months ago Rash and nonspecific skin eruption   [facility] at [facility], [place], MD   11 months ago Cellulitis of left upper extremity   [facility] at [place], Ines Bloomer, MD   1 year ago Essential hypertension   [facility] [place], Ranell Patrick, MD   1 year ago Pruritus   [facility] at [place], MD             Passed - Last BP in normal range    BP Readings from Last 1 Encounters:  10/22/17 136/80

## 2018-05-12 ENCOUNTER — Other Ambulatory Visit: Payer: Self-pay | Admitting: Family Medicine

## 2018-05-12 DIAGNOSIS — I10 Essential (primary) hypertension: Secondary | ICD-10-CM

## 2018-05-12 NOTE — Telephone Encounter (Signed)
Refill request for amlodipine 10 mg #[id number] refills approved.  Pt needs ov/labs.  Will send to schedulers to call pt and schedule ov f/u labs and meds.  Last ov 10/22/17. Dgaddy, CMA

## 2018-05-12 NOTE — Telephone Encounter (Signed)
Contacted pt regarding refill request for amlodipine; last office visit was 10/22/17; per AVS pt instructed to follow up in 6 months; pt offered and accepted appointment with Dr Merri Ray, [place] [address], 06/08/18 at 1120; she verbalizes understanding.  Requested Prescriptions  Pending Prescriptions Disp Refills  . amLODipine (NORVASC) 10 MG tablet [Pharmacy Med Name: AMLODIPINE BESYLATE 10 MG TAB] 90 tablet 1    Sig: TAKE 1 TABLET BY MOUTH EVERY DAY     Cardiovascular:  Calcium Channel Blockers Failed - 05/12/2018 12:00 PM      Failed - Valid encounter within last 6 months    Recent Outpatient Visits          6 months ago Annual physical exam   [facility], Ranell Patrick, MD   9 months ago Rash and nonspecific skin eruption   [facility] at [facility], [place], MD   11 months ago Cellulitis of left upper extremity   [facility] at [facility], Ines Bloomer, MD   1 year ago Essential hypertension   [facility] at [facility], Ranell Patrick, MD   1 year ago Pruritus   [facility] at [place], MD      Future Appointments            In 3 weeks Wendie Agreste, MD [facility] Care at [facility], [place] BP in normal range    BP Readings from Last 1 Encounters:  10/22/17 136/80

## 2018-06-08 ENCOUNTER — Ambulatory Visit: Payer: Self-pay | Admitting: Family Medicine

## 2018-08-11 ENCOUNTER — Other Ambulatory Visit: Payer: Self-pay | Admitting: Family Medicine

## 2018-08-11 DIAGNOSIS — I10 Essential (primary) hypertension: Secondary | ICD-10-CM

## 2018-08-11 NOTE — Telephone Encounter (Signed)
Called only number. A gentleman answered to state this was her work/cell number and Ms Cura is no longer at this number.

## 2018-08-11 NOTE — Telephone Encounter (Signed)
Contacted pt husband listed on DPR.  He gave me a  New number for his wife.  Call paced to patient Bethany [name] who stated that she has switched providers and would not need the refill requested.
